# Patient Record
Sex: Male | Born: 1980 | Race: White | Hispanic: No | Marital: Married | Smoking: Never smoker
Health system: Southern US, Community
[De-identification: ages and names within clinical notes are randomized; demographics above are authoritative.]

## PROBLEM LIST (undated history)

## (undated) DIAGNOSIS — Z83719 Family history of colon polyps, unspecified: Secondary | ICD-10-CM

## (undated) DIAGNOSIS — D369 Benign neoplasm, unspecified site: Secondary | ICD-10-CM

## (undated) DIAGNOSIS — R05 Cough: Secondary | ICD-10-CM

## (undated) DIAGNOSIS — S43439A Superior glenoid labrum lesion of unspecified shoulder, initial encounter: Secondary | ICD-10-CM

## (undated) DIAGNOSIS — M1990 Unspecified osteoarthritis, unspecified site: Secondary | ICD-10-CM

## (undated) HISTORY — DX: Family history of colon polyps, unspecified: Z83.719

## (undated) HISTORY — DX: Benign neoplasm, unspecified site: D36.9

## (undated) HISTORY — PX: NO PAST SURGERIES: SHX2092

---

## 2013-02-07 ENCOUNTER — Other Ambulatory Visit: Payer: Self-pay | Admitting: Orthopedic Surgery

## 2013-02-07 DIAGNOSIS — M25511 Pain in right shoulder: Secondary | ICD-10-CM

## 2013-02-17 DIAGNOSIS — M1990 Unspecified osteoarthritis, unspecified site: Secondary | ICD-10-CM

## 2013-02-17 DIAGNOSIS — S43439A Superior glenoid labrum lesion of unspecified shoulder, initial encounter: Secondary | ICD-10-CM

## 2013-02-17 HISTORY — DX: Unspecified osteoarthritis, unspecified site: M19.90

## 2013-02-17 HISTORY — DX: Superior glenoid labrum lesion of unspecified shoulder, initial encounter: S43.439A

## 2013-02-21 ENCOUNTER — Ambulatory Visit
Admission: RE | Admit: 2013-02-21 | Discharge: 2013-02-21 | Disposition: A | Discharge status: Home / Self Care | Payer: BC Managed Care – PPO | Source: Ambulatory Visit | Attending: Orthopedic Surgery | Admitting: Orthopedic Surgery

## 2013-02-21 DIAGNOSIS — M25511 Pain in right shoulder: Secondary | ICD-10-CM

## 2013-02-21 MED ORDER — IOHEXOL 180 MG/ML  SOLN
15.0000 mL | Freq: Once | INTRAMUSCULAR | Status: AC | PRN
  Administered 2013-02-21: 15 mL via INTRA_ARTICULAR

## 2013-03-12 ENCOUNTER — Encounter (HOSPITAL_BASED_OUTPATIENT_CLINIC_OR_DEPARTMENT_OTHER): Payer: Self-pay | Admitting: *Deleted

## 2013-03-12 DIAGNOSIS — R059 Cough, unspecified: Secondary | ICD-10-CM

## 2013-03-12 HISTORY — DX: Cough, unspecified: R05.9

## 2013-03-12 NOTE — Progress Notes (Signed)
03/12/13 1206  OBSTRUCTIVE SLEEP APNEA  Have you ever been diagnosed with sleep apnea through a sleep study? No  Do you snore loudly (loud enough to be heard through closed doors)?  1  Do you often feel tired, fatigued, or sleepy during the daytime? 1  Has anyone observed you stop breathing during your sleep? 1  Do you have, or are you being treated for high blood pressure? 0  BMI more than 35 kg/m2? 0  Age over 34 years old? 0  Gender: 1  Obstructive Sleep Apnea Score 4  Score 4 or greater  Results sent to PCP (Dr. Elias Else)

## 2013-03-13 ENCOUNTER — Other Ambulatory Visit: Payer: Self-pay | Admitting: Orthopedic Surgery

## 2013-03-19 NOTE — H&P (Signed)
Nathan Terrell is an 32 y.o. male.   Chief Complaint: c/o chronic and progressive right shoulder pain HPI: . 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 He is right hand dominant. He is a recreational weight lifter and very active husband and father.  He enjoys playing ice hockey. He has had no history of dislocation of the shoulder. In late May of 2014 he sustained a strain injury to his right shoulder when he was blocking in front of a net. Since that time he has had chronic pain in his right shoulder he perceives anteriorly aggravated by push ups or other weight training exercises. He has tried to go to the gym repeatedly and cannot lift more than a few moments without significant shoulder pain. He has difficulty reaching behind his back. He has some weakness with overhead activities.   Past Medical History  Diagnosis Date  . SLAP lesion, type II 02/2013    right  . 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 Cough 03/12/2013    Past Surgical History  Procedure Laterality Date  . No past surgeries      History reviewed. No pertinent family history. Social History:  reports that he has never smoked. He has never used smokeless tobacco. He reports that he drinks alcohol. He reports that he does not use illicit drugs.  Allergies: No Known Allergies  No prescriptions prior to admission    No results found for this or any previous visit (from the past 48 hour(s)).  No results found.   A comprehensive review of systems was negative.  Height 5\' 10"  (1.778 m), weight 81.647 kg (180 lb).  General appearance: alert Head: Normocephalic, without obvious abnormality Neck: supple, symmetrical, trachea midline Resp: clear to auscultation bilaterally Cardio: regular rate and rhythm GI: normal findings: bowel sounds normal Extremities: His shoulder ROM reveals combined elevation right shoulder 165, left shoulder 175, external  rotation right shoulder at 90 degrees abduction 90 degrees, left shoulder 95, internal rotation right shoulder T10, left shoulder T8. He has tightness with internal rotation behind his back and has a painful push off test on the right, negative on the left. Isolated muscle testing reveals minimal pain and full strength in internal/external rotation at neutral, scaption and forward flexion His O'Brien's test is painful. He has pain with cross torso adduction. He is nontender on direct palpation of the long head of the biceps. His Yergason's maneuver is painless.   Plain films of his shoulder demonstrate normal bony anatomy. He does not show signs of significant glenohumeral arthritis or AC degenerative change. He does have a type I acromion. There is no evidence of calcification.   MRI of his right shoulder.  Dang has advanced AC degenerative arthritis with unfavorable morphology of his acromion.  This is the reason for his night pain.  He also has a type II SLAP lesion with instability of the proximal origin of the biceps extending from 12 o'clock to 10 o'clock posteriorly.     Pulses: 2+ and symmetric Skin: normal Neurologic: Grossly normal    Assessment/Plan Impression:  Internal derangement of right shoulder.  Acromio-clavicular arthritis. Sub-a.c. joint impingement symptoms. Type II SLAP lesion.  Plan: To the OR for right SA with SAD/DCR and biceps tenodesis and possible RC repair.The procedure, risks,benefits and post-op course were discussed with the patient at length and they were in agreement with the plan.  DASNOIT,Nathan Terrell 03/19/2013, 4:48 PM  H&P documentation: 03/20/2013  -  History and Physical Reviewed  -Patient has been re-examined  -No change in the plan of care  Wyn Forster, MD

## 2013-03-20 ENCOUNTER — Ambulatory Visit (HOSPITAL_BASED_OUTPATIENT_CLINIC_OR_DEPARTMENT_OTHER)
Admission: RE | Admit: 2013-03-20 | Discharge: 2013-03-20 | Disposition: A | Discharge status: Home / Self Care | Payer: BC Managed Care – PPO | Source: Ambulatory Visit | Attending: Orthopedic Surgery | Admitting: Orthopedic Surgery

## 2013-03-20 ENCOUNTER — Encounter (HOSPITAL_BASED_OUTPATIENT_CLINIC_OR_DEPARTMENT_OTHER): Payer: BC Managed Care – PPO | Admitting: Anesthesiology

## 2013-03-20 ENCOUNTER — Ambulatory Visit (HOSPITAL_BASED_OUTPATIENT_CLINIC_OR_DEPARTMENT_OTHER): Payer: BC Managed Care – PPO | Admitting: Anesthesiology

## 2013-03-20 ENCOUNTER — Encounter (HOSPITAL_BASED_OUTPATIENT_CLINIC_OR_DEPARTMENT_OTHER): Payer: Self-pay | Admitting: *Deleted

## 2013-03-20 ENCOUNTER — Encounter (HOSPITAL_BASED_OUTPATIENT_CLINIC_OR_DEPARTMENT_OTHER)
Admission: RE | Disposition: A | Discharge status: Home / Self Care | Payer: Self-pay | Source: Ambulatory Visit | Attending: Orthopedic Surgery

## 2013-03-20 DIAGNOSIS — M19019 Primary osteoarthritis, unspecified shoulder: Secondary | ICD-10-CM | POA: Insufficient documentation

## 2013-03-20 DIAGNOSIS — M24119 Other articular cartilage disorders, unspecified shoulder: Secondary | ICD-10-CM | POA: Insufficient documentation

## 2013-03-20 DIAGNOSIS — M25819 Other specified joint disorders, unspecified shoulder: Secondary | ICD-10-CM | POA: Insufficient documentation

## 2013-03-20 DIAGNOSIS — M24419 Recurrent dislocation, unspecified shoulder: Secondary | ICD-10-CM | POA: Insufficient documentation

## 2013-03-20 DIAGNOSIS — Z5333 Arthroscopic surgical procedure converted to open procedure: Secondary | ICD-10-CM | POA: Insufficient documentation

## 2013-03-20 HISTORY — DX: Unspecified osteoarthritis, unspecified site: M19.90

## 2013-03-20 HISTORY — DX: Superior glenoid labrum lesion of unspecified shoulder, initial encounter: S43.439A

## 2013-03-20 HISTORY — PX: SHOULDER ARTHROSCOPY WITH SUBACROMIAL DECOMPRESSION, ROTATOR CUFF REPAIR AND BICEP TENDON REPAIR: SHX5687

## 2013-03-20 HISTORY — DX: Cough: R05

## 2013-03-20 LAB — POCT HEMOGLOBIN-HEMACUE: Hemoglobin: 16.3 g/dL (ref 13.0–17.0)

## 2013-03-20 SURGERY — SHOULDER ARTHROSCOPY WITH SUBACROMIAL DECOMPRESSION, ROTATOR CUFF REPAIR AND BICEP TENDON REPAIR
Anesthesia: General | Site: Shoulder | Laterality: Right

## 2013-03-20 MED ORDER — LIDOCAINE HCL (CARDIAC) 10 MG/ML IV SOLN
INTRAVENOUS | Status: DC | PRN
  Administered 2013-03-20: 100 mg via INTRAVENOUS

## 2013-03-20 MED ORDER — HYDROMORPHONE HCL 2 MG PO TABS: ORAL_TABLET | ORAL | Status: DC

## 2013-03-20 MED ORDER — MIDAZOLAM HCL 5 MG/5ML IJ SOLN
INTRAMUSCULAR | Status: DC | PRN
  Administered 2013-03-20: 2 mg via INTRAVENOUS

## 2013-03-20 MED ORDER — BUPIVACAINE-EPINEPHRINE PF 0.5-1:200000 % IJ SOLN
INTRAMUSCULAR | Status: DC | PRN
  Administered 2013-03-20: 25 mL

## 2013-03-20 MED ORDER — LACTATED RINGERS IV SOLN
INTRAVENOUS | Status: DC
  Administered 2013-03-20: 14:00:00 via INTRAVENOUS
  Administered 2013-03-20: 20 mL/h via INTRAVENOUS

## 2013-03-20 MED ORDER — DEXAMETHASONE SODIUM PHOSPHATE 4 MG/ML IJ SOLN
INTRAMUSCULAR | Status: DC | PRN
  Administered 2013-03-20: 10 mg via INTRAVENOUS

## 2013-03-20 MED ORDER — HYDROMORPHONE HCL PF 1 MG/ML IJ SOLN: 0.2500 mg | INTRAMUSCULAR | Status: DC | PRN

## 2013-03-20 MED ORDER — HYDROMORPHONE HCL 2 MG PO TABS: 2.0000 mg | ORAL_TABLET | ORAL | Status: DC | PRN

## 2013-03-20 MED ORDER — PROMETHAZINE HCL 25 MG/ML IJ SOLN
6.2500 mg | INTRAMUSCULAR | Status: DC | PRN
  Administered 2013-03-20: 6.25 mg via INTRAVENOUS

## 2013-03-20 MED ORDER — SUCCINYLCHOLINE CHLORIDE 20 MG/ML IJ SOLN
INTRAMUSCULAR | Status: DC | PRN
  Administered 2013-03-20: 100 mg via INTRAVENOUS

## 2013-03-20 MED ORDER — MIDAZOLAM HCL 2 MG/ML PO SYRP: 12.0000 mg | ORAL_SOLUTION | Freq: Once | ORAL | Status: DC | PRN

## 2013-03-20 MED ORDER — CEFAZOLIN SODIUM-DEXTROSE 2-3 GM-% IV SOLR
2.0000 g | Freq: Once | INTRAVENOUS | Status: AC
  Administered 2013-03-20: 2 g via INTRAVENOUS

## 2013-03-20 MED ORDER — FENTANYL CITRATE 0.05 MG/ML IJ SOLN
INTRAMUSCULAR | Status: AC
  Filled 2013-03-20: qty 2

## 2013-03-20 MED ORDER — MIDAZOLAM HCL 2 MG/2ML IJ SOLN
INTRAMUSCULAR | Status: AC
  Filled 2013-03-20: qty 2

## 2013-03-20 MED ORDER — CHLORHEXIDINE GLUCONATE 4 % EX LIQD: 60.0000 mL | Freq: Once | CUTANEOUS | Status: DC

## 2013-03-20 MED ORDER — MIDAZOLAM HCL 2 MG/2ML IJ SOLN
1.0000 mg | INTRAMUSCULAR | Status: DC | PRN
  Administered 2013-03-20: 2 mg via INTRAVENOUS

## 2013-03-20 MED ORDER — PROMETHAZINE HCL 25 MG/ML IJ SOLN
INTRAMUSCULAR | Status: AC
  Filled 2013-03-20: qty 1

## 2013-03-20 MED ORDER — SODIUM CHLORIDE 0.9 % IR SOLN
Status: DC | PRN
  Administered 2013-03-20: 8000 mL

## 2013-03-20 MED ORDER — DEXAMETHASONE SODIUM PHOSPHATE 10 MG/ML IJ SOLN
INTRAMUSCULAR | Status: DC | PRN
  Administered 2013-03-20: 4 mg

## 2013-03-20 MED ORDER — FENTANYL CITRATE 0.05 MG/ML IJ SOLN
INTRAMUSCULAR | Status: DC | PRN
  Administered 2013-03-20 (×4): 25 ug via INTRAVENOUS

## 2013-03-20 MED ORDER — ONDANSETRON HCL 4 MG/2ML IJ SOLN
INTRAMUSCULAR | Status: DC | PRN
  Administered 2013-03-20: 4 mg via INTRAVENOUS

## 2013-03-20 MED ORDER — MIDAZOLAM HCL 2 MG/2ML IJ SOLN: 1.0000 mg | INTRAMUSCULAR | Status: DC | PRN

## 2013-03-20 MED ORDER — CEPHALEXIN 500 MG PO CAPS: 500.0000 mg | ORAL_CAPSULE | Freq: Three times a day (TID) | ORAL | Status: DC

## 2013-03-20 MED ORDER — OXYCODONE HCL 5 MG/5ML PO SOLN: 5.0000 mg | Freq: Once | ORAL | Status: DC | PRN

## 2013-03-20 MED ORDER — OXYCODONE HCL 5 MG PO TABS: 5.0000 mg | ORAL_TABLET | Freq: Once | ORAL | Status: DC | PRN

## 2013-03-20 MED ORDER — FENTANYL CITRATE 0.05 MG/ML IJ SOLN: 50.0000 ug | INTRAMUSCULAR | Status: DC | PRN

## 2013-03-20 MED ORDER — PROPOFOL 10 MG/ML IV BOLUS
INTRAVENOUS | Status: DC | PRN
  Administered 2013-03-20: 200 mg via INTRAVENOUS

## 2013-03-20 MED ORDER — FENTANYL CITRATE 0.05 MG/ML IJ SOLN
50.0000 ug | Freq: Once | INTRAMUSCULAR | Status: AC
  Administered 2013-03-20: 100 ug via INTRAVENOUS

## 2013-03-20 SURGICAL SUPPLY — 81 items
BANDAGE ADHESIVE 1X3 (GAUZE/BANDAGES/DRESSINGS) IMPLANT
BLADE AVERAGE 25X9 (BLADE) IMPLANT
BLADE CUTTER MENIS 5.5 (BLADE) ×2 IMPLANT
BLADE SURG 15 STRL LF DISP TIS (BLADE) ×2 IMPLANT
BLADE SURG 15 STRL SS (BLADE) ×2
BLADE SURG ROTATE 9660 (MISCELLANEOUS) ×2 IMPLANT
BUR EGG 3PK/BX (BURR) IMPLANT
BUR OVAL 6.0 (BURR) ×2 IMPLANT
CANISTER SUCT 3000ML (MISCELLANEOUS) IMPLANT
CANISTER SUCT LVC 12 LTR MEDI- (MISCELLANEOUS) IMPLANT
CANNULA SHOULDER 7CM (CANNULA) IMPLANT
CANNULA TWIST IN 8.25X7CM (CANNULA) ×2 IMPLANT
CLEANER CAUTERY TIP 5X5 PAD (MISCELLANEOUS) IMPLANT
CUTTER MENISCUS  4.2MM (BLADE) ×1
CUTTER MENISCUS 4.2MM (BLADE) ×1 IMPLANT
DECANTER SPIKE VIAL GLASS SM (MISCELLANEOUS) IMPLANT
DRAPE INCISE IOBAN 66X45 STRL (DRAPES) ×2 IMPLANT
DRAPE STERI 35X30 U-POUCH (DRAPES) ×2 IMPLANT
DRAPE SURG 17X23 STRL (DRAPES) ×2 IMPLANT
DRAPE U-SHAPE 47X51 STRL (DRAPES) ×2 IMPLANT
DRAPE U-SHAPE 76X120 STRL (DRAPES) ×4 IMPLANT
DRSG PAD ABDOMINAL 8X10 ST (GAUZE/BANDAGES/DRESSINGS) ×2 IMPLANT
DURAPREP 26ML APPLICATOR (WOUND CARE) ×2 IMPLANT
ELECT REM PT RETURN 9FT ADLT (ELECTROSURGICAL) ×2
ELECTRODE REM PT RTRN 9FT ADLT (ELECTROSURGICAL) ×1 IMPLANT
GLOVE BIOGEL M STRL SZ7.5 (GLOVE) ×2 IMPLANT
GLOVE BIOGEL PI IND STRL 7.0 (GLOVE) ×2 IMPLANT
GLOVE BIOGEL PI IND STRL 8 (GLOVE) ×3 IMPLANT
GLOVE BIOGEL PI INDICATOR 7.0 (GLOVE) ×2
GLOVE BIOGEL PI INDICATOR 8 (GLOVE) ×3
GLOVE ECLIPSE 6.5 STRL STRAW (GLOVE) ×4 IMPLANT
GLOVE ORTHO TXT STRL SZ7.5 (GLOVE) ×2 IMPLANT
GOWN BRE IMP PREV XXLGXLNG (GOWN DISPOSABLE) ×2 IMPLANT
GOWN PREVENTION PLUS XLARGE (GOWN DISPOSABLE) ×2 IMPLANT
KIT BIO-TENODESIS 3X8 DISP (MISCELLANEOUS) ×1
KIT INSRT BABSR STRL DISP BTN (MISCELLANEOUS) ×1 IMPLANT
NDL SUT 6 .5 CRC .975X.05 MAYO (NEEDLE) ×1 IMPLANT
NEEDLE MAYO TAPER (NEEDLE) ×1
NEEDLE MINI RC 24MM (NEEDLE) IMPLANT
NEEDLE SCORPION (NEEDLE) ×2 IMPLANT
PACK ARTHROSCOPY DSU (CUSTOM PROCEDURE TRAY) ×2 IMPLANT
PACK BASIN DAY SURGERY FS (CUSTOM PROCEDURE TRAY) ×2 IMPLANT
PAD CLEANER CAUTERY TIP 5X5 (MISCELLANEOUS)
PASSER SUT SWANSON 36MM LOOP (INSTRUMENTS) IMPLANT
PENCIL BUTTON HOLSTER BLD 10FT (ELECTRODE) ×2 IMPLANT
SCREW PEEK TENODESIS 7X23M (Screw) ×2 IMPLANT
SLEEVE SCD COMPRESS KNEE MED (MISCELLANEOUS) ×2 IMPLANT
SLING ARM FOAM STRAP LRG (SOFTGOODS) IMPLANT
SLING ARM FOAM STRAP MED (SOFTGOODS) IMPLANT
SLING ARM FOAM STRAP XLG (SOFTGOODS) ×2 IMPLANT
SPONGE GAUZE 4X4 12PLY (GAUZE/BANDAGES/DRESSINGS) ×2 IMPLANT
SPONGE LAP 4X18 X RAY DECT (DISPOSABLE) ×2 IMPLANT
STRIP CLOSURE SKIN 1/2X4 (GAUZE/BANDAGES/DRESSINGS) ×2 IMPLANT
SUCTION FRAZIER TIP 10 FR DISP (SUCTIONS) IMPLANT
SUT ETHIBOND 2 OS 4 DA (SUTURE) IMPLANT
SUT ETHILON 4 0 PS 2 18 (SUTURE) IMPLANT
SUT FIBERWIRE #2 38 T-5 BLUE (SUTURE) ×2
SUT FIBERWIRE 3-0 18 TAPR NDL (SUTURE)
SUT PROLENE 1 CT (SUTURE) IMPLANT
SUT PROLENE 3 0 PS 2 (SUTURE) ×2 IMPLANT
SUT TIGER TAPE 7 IN WHITE (SUTURE) IMPLANT
SUT VIC AB 0 CT1 27 (SUTURE)
SUT VIC AB 0 CT1 27XBRD ANBCTR (SUTURE) IMPLANT
SUT VIC AB 0 SH 27 (SUTURE) ×2 IMPLANT
SUT VIC AB 2-0 SH 27 (SUTURE) ×1
SUT VIC AB 2-0 SH 27XBRD (SUTURE) ×1 IMPLANT
SUT VIC AB 3-0 SH 27 (SUTURE)
SUT VIC AB 3-0 SH 27X BRD (SUTURE) IMPLANT
SUT VIC AB 3-0 X1 27 (SUTURE) IMPLANT
SUTURE FIBERWR #2 38 T-5 BLUE (SUTURE) ×1 IMPLANT
SUTURE FIBERWR 3-0 18 TAPR NDL (SUTURE) IMPLANT
SYR 3ML 23GX1 SAFETY (SYRINGE) IMPLANT
SYR BULB 3OZ (MISCELLANEOUS) IMPLANT
TAPE FIBER 2MM 7IN #2 BLUE (SUTURE) ×2 IMPLANT
TAPE PAPER 3X10 WHT MICROPORE (GAUZE/BANDAGES/DRESSINGS) ×2 IMPLANT
TOWEL OR 17X24 6PK STRL BLUE (TOWEL DISPOSABLE) ×2 IMPLANT
TUBE CONNECTING 20X1/4 (TUBING) ×4 IMPLANT
TUBING ARTHROSCOPY IRRIG 16FT (MISCELLANEOUS) ×2 IMPLANT
WAND STAR VAC 90 (SURGICAL WAND) ×2 IMPLANT
WATER STERILE IRR 1000ML POUR (IV SOLUTION) ×2 IMPLANT
YANKAUER SUCT BULB TIP NO VENT (SUCTIONS) IMPLANT

## 2013-03-20 NOTE — Progress Notes (Signed)
Assisted Dr. Kasik with right, ultrasound guided, interscalene  block. Side rails up, monitors on throughout procedure. See vital signs in flow sheet. Tolerated Procedure well. 

## 2013-03-20 NOTE — Anesthesia Procedure Notes (Addendum)
Procedure Name: Intubation Date/Time: 03/20/2013 1:14 PM Performed by: Gar Gibbon Pre-anesthesia Checklist: Patient identified, Emergency Drugs available, Suction available and Patient being monitored Patient Re-evaluated:Patient Re-evaluated prior to inductionOxygen Delivery Method: Circle System Utilized Preoxygenation: Pre-oxygenation with 100% oxygen Intubation Type: IV induction Ventilation: Mask ventilation without difficulty Laryngoscope Size: Miller and 3 Grade View: Grade II Tube type: Oral Tube size: 8.0 mm Number of attempts: 1 Airway Equipment and Method: stylet and oral airway Placement Confirmation: ETT inserted through vocal cords under direct vision,  positive ETCO2 and breath sounds checked- equal and bilateral Secured at: 22 cm Tube secured with: Tape Dental Injury: Teeth and Oropharynx as per pre-operative assessment    Anesthesia Regional Block:  Interscalene brachial plexus block  Pre-Anesthetic Checklist: ,, timeout performed, Correct Patient, Correct Site, Correct Laterality, Correct Procedure, Correct Position, site marked, Risks and benefits discussed,  Surgical consent,  Pre-op evaluation,  At surgeon's request and post-op pain management  Laterality: Right  Prep: chloraprep       Needles:  Injection technique: Single-shot  Needle Type: Echogenic Stimulator Needle     Needle Length: 5cm 5 cm Needle Gauge: 22 and 22 G    Additional Needles:  Procedures: ultrasound guided (picture in chart) Interscalene brachial plexus block Narrative:  Start time: 03/20/2013 12:42 PM End time: 03/20/2013 12:55 PM Injection made incrementally with aspirations every 3 mL. Anesthesiologist: Dr Gypsy Balsam  Additional Notes: [id number] R ISB POP CHG prep, sterile tech #22 echo needle with [place] visualization-PIX in chart Marc .5% w/epi 1:200000 total 25cc+decadron 4mg  infiltrated Multiple neg asp No compl Dr Gypsy Balsam

## 2013-03-20 NOTE — Transfer of Care (Signed)
Immediate Anesthesia Transfer of Care Note  Patient: Remington Highbaugh  Procedure(s) Performed: Procedure(s): RIGHT SHOULDER ARTHROSCOPY WITH SUBACROMIAL DECOMPRESSION, DISTAL CLAVICLE RESECTION, OPEN BICEPS TENODESIS (Right)  Patient Location: PACU  Anesthesia Type:GA combined with regional for post-op pain  Level of Consciousness: awake, sedated and patient cooperative  Airway & Oxygen Therapy: Patient Spontanous Breathing and Patient connected to face mask oxygen  Post-op Assessment: Report given to PACU RN and Post -op Vital signs reviewed and stable  Post vital signs: Reviewed and stable  Complications: No apparent anesthesia complications

## 2013-03-20 NOTE — Anesthesia Postprocedure Evaluation (Signed)
  Anesthesia Post-op Note  Patient: Nathan Terrell  Procedure(s) Performed: Procedure(s): RIGHT SHOULDER ARTHROSCOPY WITH SUBACROMIAL DECOMPRESSION, DISTAL CLAVICLE RESECTION, OPEN BICEPS TENODESIS (Right)  Patient Location: PACU  Anesthesia Type:GA combined with regional for post-op pain  Level of Consciousness: awake  Airway and Oxygen Therapy: Patient Spontanous Breathing  Post-op Pain: none  Post-op Assessment: Post-op Vital signs reviewed, Patient's Cardiovascular Status Stable, Respiratory Function Stable, Patent Airway, No signs of Nausea or vomiting and Pain level controlled  Post-op Vital Signs: Reviewed and stable  Complications: No apparent anesthesia complications

## 2013-03-20 NOTE — Anesthesia Preprocedure Evaluation (Signed)
Anesthesia Evaluation  Patient identified by MRN, date of birth, ID band Patient awake    Reviewed: Allergy & Precautions, H&P , NPO status , Patient's Chart, lab work & pertinent test results  Airway Mallampati: I TM Distance: >3 FB Neck ROM: Full    Dental   Pulmonary  breath sounds clear to auscultation        Cardiovascular Rhythm:Regular Rate:Normal     Neuro/Psych    GI/Hepatic   Endo/Other    Renal/GU      Musculoskeletal   Abdominal   Peds  Hematology   Anesthesia Other Findings   Reproductive/Obstetrics                           Anesthesia Physical Anesthesia Plan  ASA: I  Anesthesia Plan: General   Post-op Pain Management:    Induction: Intravenous  Airway Management Planned: Oral ETT  Additional Equipment:   Intra-op Plan:   Post-operative Plan: Extubation in OR  Informed Consent: I have reviewed the patients History and Physical, chart, labs and discussed the procedure including the risks, benefits and alternatives for the proposed anesthesia with the patient or authorized representative who has indicated his/her understanding and acceptance.     Plan Discussed with: CRNA and Surgeon  Anesthesia Plan Comments:         Anesthesia Quick Evaluation  

## 2013-03-20 NOTE — Brief Op Note (Signed)
03/20/2013  2:55 PM  PATIENT:  Nathan Terrell  32 y.o. male  PRE-OPERATIVE DIAGNOSIS:  RIGHT SLAP II, ACROMIOCLAVICULAR DEGENERATIVE JOINT DISEASE  POST-OPERATIVE DIAGNOSIS:  RIGHT SLAP II, ACROMIOCLAVICULAR DEGENERATIVE JOINT DISEASE  PROCEDURE:  Procedure(s): RIGHT SHOULDER ARTHROSCOPY WITH SUBACROMIAL DECOMPRESSION, DISTAL CLAVICLE RESECTION, OPEN BICEPS TENODESIS (Right)  SURGEON:  Surgeon(s) and Role:    * Wyn Forster., MD - Primary  PHYSICIAN ASSISTANT:   ASSISTANTS: Mallory Shirk.A-C    ANESTHESIA:   general  EBL:  Total I/O In: 1000 [I.V.:1000] Out: -   BLOOD ADMINISTERED:none  DRAINS: none   LOCAL MEDICATIONS USED: Interscalene block  SPECIMEN:  No Specimen  DISPOSITION OF SPECIMEN:  N/A  COUNTS:  YES  TOURNIQUET:  * No tourniquets in log *  DICTATION: .Other Dictation: Dictation Number [id number]  PLAN OF CARE: Discharge to home after PACU  PATIENT DISPOSITION:  PACU - hemodynamically stable.   Delay start of Pharmacological VTE agent (>24hrs) due to surgical blood loss or risk of bleeding: not applicable

## 2013-03-21 NOTE — Op Note (Signed)
NAME:  SHAHID, FLORI NO.:  [id number]  MEDICAL RECORD NO.:  [id number]  LOCATION:                                 FACILITY:  PHYSICIAN:  Katy Fitch. Renee Erb, M.D.      DATE OF BIRTH:  DATE OF PROCEDURE:  03/20/2013 DATE OF DISCHARGE:                              OPERATIVE REPORT   PREOPERATIVE DIAGNOSES:  MRI-documented type 2 superior labrum anterior and posterior lesion with unstable biceps origin, and unfavorable acromioclavicular anatomy, and type 3 acromion with impingement of anterior supraspinatus rotator cuff and rotator interval.  POSTOPERATIVE DIAGNOSES:  Confirmation of an unstable type 2 superior labrum anterior and posterior lesion with gross degeneration of anterior superior and posterior superior labrum and type 3 acromion, and prominent distal clavicle due to [facility] degenerative arthritis.  OPERATION: 1. Diagnostic arthroscopy, right glenohumeral joint followed by     arthroscopic biceps tenotomy and debridement of unstable labrum. 2. Arthroscopic subacromial decompression with bursectomy     coracoacromial ligament release, and acromioplasty. 3. Arthroscopic distal clavicle resection. 4. Open supra-pectoral biceps tenodesis utilizing an Arthrex 7-mm     tenodesis screw.  OPERATING SURGEON:  Katy Fitch. Edie Vallandingham, M.D.  ASSISTANT:  Marveen Reeks Dasnoit, PA-C  ANESTHESIA:  General endotracheal supplemented by an interscalene block.  SUPERVISING ANESTHESIOLOGIST:  Bedelia Person, M.D.  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  He enjoys playing recreational hockey and works out regularly in [employer]  After a blocking injury in front of a hockey net last year, he has had chronic right shoulder pain.  He has tried activity modification, anti- inflammatory medications, and rest; all without relief.  He was referred by his primary care physician, Dr. Elias Else for evaluation of this predicament.  Clinical  examination revealed signs of AC degenerative arthritis with direct tenderness at the [facility] joint and a clinical examination suggesting internal derangement of the shoulder, likely a SLAP lesion or biceps tendon injury. He was referred for an MR arthrogram of his shoulder, which documented an unstable type 2 SLAP lesion, and some labral irregularities anteriorly. He also was noted to have marked arthritis at the distal clavicle with edema in the distal clavicle and a prominent inferior distal clavicle and anterior acromion.  There was some signal changes in the supraspinatus due to chronic abrasion beneath the coracoacromial ligament and anterior acromion.  We advised Mr. Placencia as he had symptoms for more than 1 year to proceed with diagnostic arthroscopy anticipating decompression of his shoulder, biceps tenodesis, and labral debridement.  We did discuss labral repair, however, given his desire to continue weight training and playing hockey, in my judgment, a labral repair was not a good idea.  After detailed informed consent, he is brought to the operating room at this time.  Preoperatively, he was interviewed by Dr. Gypsy Balsam of Anesthesia.  General anesthesia by endotracheal technique was recommended and accepted.  Dr. Gypsy Balsam placed an interscalene block in the holding area under ultrasound guidance without complication leading to excellent anesthesia of the right arm and forequarter.  PROCEDURE:  Rocky Gladden was taken to room 2 of the [facility]  and placed in a supine position upon the operating table.  His right arm and shoulder had been marked per protocol with a marking pen as a proper surgical site.  In room 2 under Dr. Burnett Corrente direct supervision, general endotracheal anesthesia was induced followed by positioning in the beach-chair position with aid of a torso and head holder designed for shoulder arthroscopy.  Prior to the induction of anesthesia, passive  compression devices had been applied to his calves.  Taking care to assure that all bony prominences were protected and his knees relaxed with a pillow behind the knees, we subsequently prepped the right arm and shoulder with DuraPrep and placed impervious arthroscopy drapes.  The procedure commenced with a routine surgical time-out.  2 g of Ancef had been administered as an IV prophylactic antibiotic.  The shoulder was instrumented from the anterior aspect with a switching stick followed by placing the scope through a standard posterior viewing portal with blunt technique.  Diagnostic arthroscopy confirmed labral degenerative changes, which were documented with digital camera.  An anterior portal was created under direct vision with a spinal needle followed by a clear portal followed by use of a nerve hook to palpate the labrum and biceps origin.  There was a positive peel-back test and complete degeneration of the labrum from 2 o'clock anteriorly to approximately 10 o'clock posteriorly.  In my judgment, a SLAP repair was not indicated.  We then used a 5.5 mm suction shaver to debride the labrum, the biceps down to a 2 mm bridge and debris from the joint including synovitis.  Hemostasis was obtained with bipolar cautery.  After photographic documentation of the condition of the glenoid, humeral head, and remaining rotator cuff, we removed the arthroscope from the glenohumeral joint and placed in the subacromial space.  Florid bursitis was encountered, which was cleared with a 5.5 mm suction shaver.  The coracoacromial ligament was identified and released with cutting cautery followed by leveling the acromion to a type 1 morphology with the suction bur.  After further bursectomy, we inspected the cuff and found the bursal surface of the cuff intact.  The distal clavicle was indeed prominent, therefore an anterior portal was created and a suction bur was used to remove the distal  centimeter of clavicle.  Hemostasis was achieved with bipolar cautery, followed by photographic documentation of the digital camera.  We then marked the biceps with a spinal needle and proceeded to perform a 2-cm muscle-splitting incision to the anterior middle third of the deltoid.  The biceps was identified by palpation.  A whip stitch was placed in the biceps with 6 Krackow passes followed by release of the proximal biceps by traction.  We then completed a supra-pectoral biceps tenodesis with a 7 mm Arthrex tenodesis screw.  Excellent purchase was achieved with the cortical bone.  The redundant tendon had been debrided.  The tendon and bursal split was repaired with a running suture of 2-0 FiberWire with knots buried.  We then placed the scope back in the glenohumeral joint and performed a final debridement and irrigation of the joint.  Documentation of the biceps tenodesis was accomplished.  Further hemostasis achieved followed by removal of the arthroscopic equipment, repair of the portals with intradermal 3-0 Prolene.  The deltoid split was repaired with subcutaneous 2-0 Vicryl and intradermal 3-0 Prolene with Steri-Strips.  For aftercare, Mr. Bulman is discharged to the care of his family.  We will see him back in the office for followup evaluation in 24  hours to change his dressing to Tegaderm and begin immediate range of motion exercises.  Preoperatively, he was reminded that the biggest risk for this procedure would be the development of some shoulder stiffness if he does not participate in a postoperative exercise program.  We will reinforce these conditions in the morning.  For aftercare, he is discharged with prescription for Dilaudid 2 mg 1 or 2 tablets p.o. q.4-6 hours p.r.n. pain, 30 tablets without refill, also Keflex 500 mg 1 p.o. q.8 hours x4 days as prophylactic antibiotic.     Katy Fitch Dorcas Melito, M.D.     RVS/MEDQ  D:  03/20/2013  T:  03/21/2013  Job:   [id number]  cc:   Fulton Mole, MD

## 2013-03-22 ENCOUNTER — Encounter (HOSPITAL_BASED_OUTPATIENT_CLINIC_OR_DEPARTMENT_OTHER): Payer: Self-pay | Admitting: Orthopedic Surgery

## 2015-09-01 DIAGNOSIS — K219 Gastro-esophageal reflux disease without esophagitis: Secondary | ICD-10-CM | POA: Diagnosis not present

## 2015-09-01 DIAGNOSIS — M25532 Pain in left wrist: Secondary | ICD-10-CM | POA: Diagnosis not present

## 2015-10-02 IMAGING — RF DG FLUORO GUIDE NDL PLC/BX
2 series · 2 of 2 positions shown · non-contrast
Comparison: none

CLINICAL DATA: Right shoulder pain. Labral tear. Leydi Laura injury.

[Series 1: (hospital) · 1 of 1 slices shown (1 of 2)]
[im 1/1]
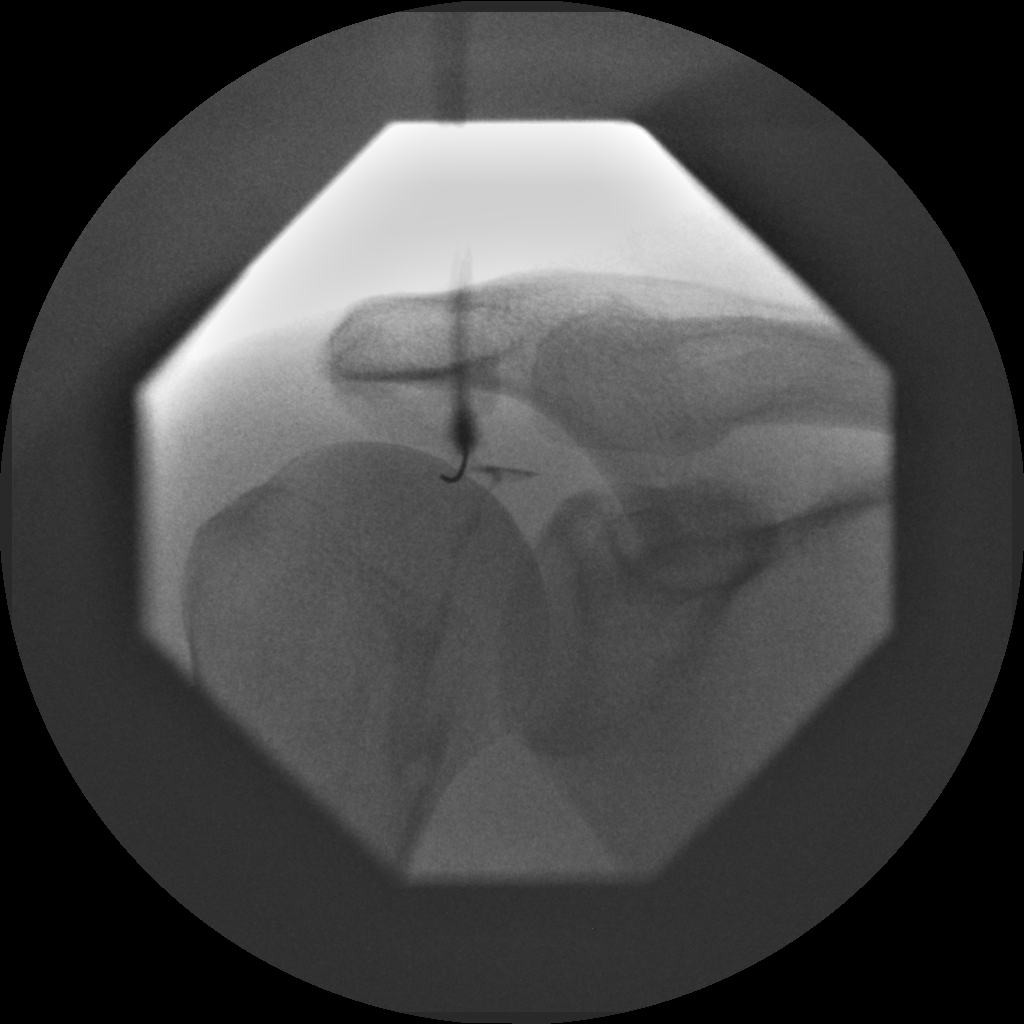

[Series 2: (hospital) · 1 of 1 slices shown (2 of 2)]
[im 1/1]
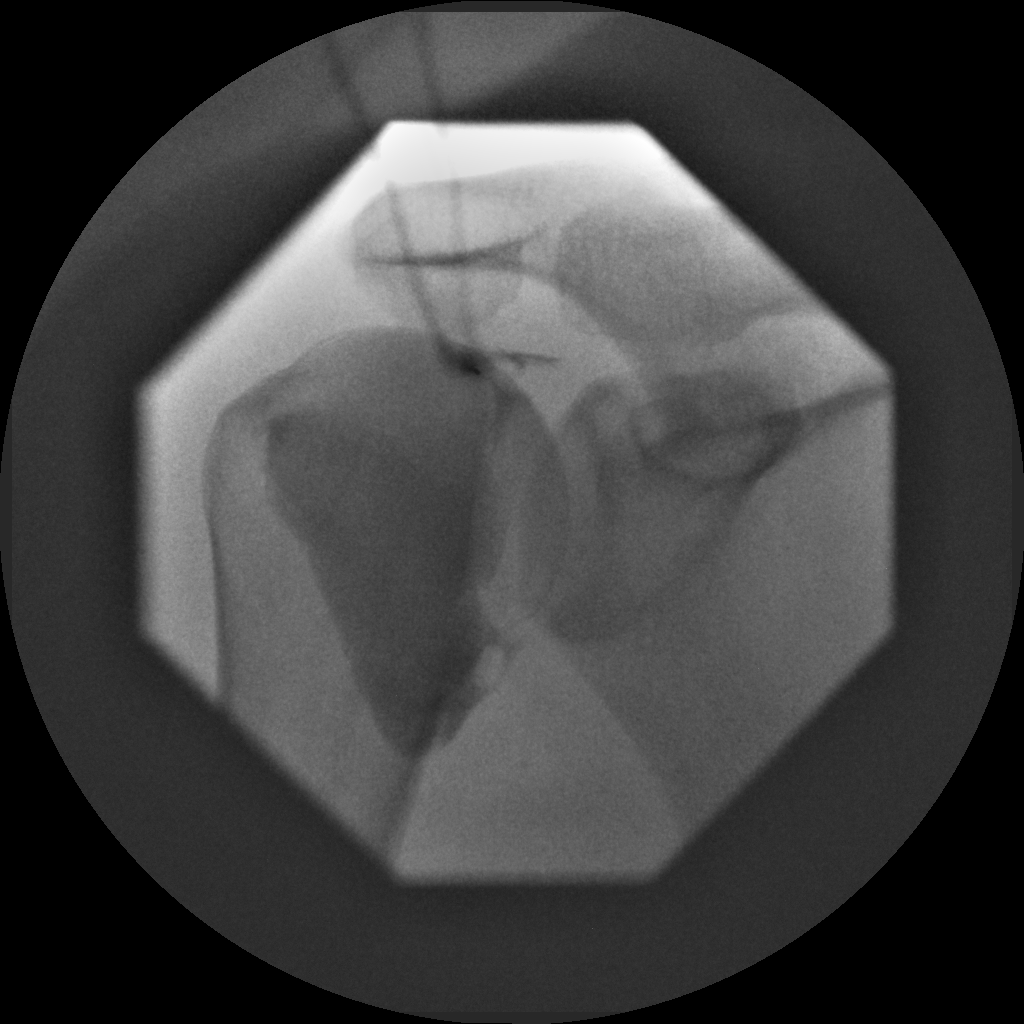

[2 of 2 positions shown; findings below may reference images not displayed]

EXAM:
SHOULDER INJECTION FOR MRI

FLUOROSCOPY TIME:  0 min 15 seconds

PROCEDURE:
After a thorough discussion of risks and benefits of the procedure,
written and oral informed consent was obtained. The consent
discussion included the risk of bleeding, infection and injury to
nerves and adjacent blood vessels. Extra-articular injection was
also a possible risk discussed. Verbal consent was obtained by Dr.
Mhlengi.

Preliminary localization was performed over the right shoulder. The
area was marked over superior medial anterior humeral head.

After prep and drape in the usual sterile fashion, the skin and
deeper subcutaneous tissues were anesthetized with 1% Lidocaine
without Epinephrine. Under fluoroscopic guidance, a 22 gauge
inch spinal needle was advanced into the joint over the superior
medial anterior humeral head using an anterior approach.
Intra-articular injection of Lidocaine was performed which flowed
freely and subsequently the joint was distended with 13 ml of a
[DATE] dilution of Multihance contrast. The MR arthrogram solution
was as follows: 15 ml of omnipaque 180 contrast agent, 0.1 mL
Multihance, 5 ml of 1% Lidocaine. An end point was felt as well as
the patient experiencing pressure and the injection was
discontinued, the needle removed, and a sterile dressing applied.
The patient was taken to MRI for subsequent imaging.

The patient tolerated the procedure well and there were no
complications.
IMPRESSION: Successful right shoulder fluoroscopically guided injection.

## 2016-04-26 DIAGNOSIS — Z13220 Encounter for screening for lipoid disorders: Secondary | ICD-10-CM | POA: Diagnosis not present

## 2016-04-26 DIAGNOSIS — Z23 Encounter for immunization: Secondary | ICD-10-CM | POA: Diagnosis not present

## 2016-04-26 DIAGNOSIS — Z0000 Encounter for general adult medical examination without abnormal findings: Secondary | ICD-10-CM | POA: Diagnosis not present

## 2016-05-24 DIAGNOSIS — R945 Abnormal results of liver function studies: Secondary | ICD-10-CM | POA: Diagnosis not present

## 2017-01-25 DIAGNOSIS — L720 Epidermal cyst: Secondary | ICD-10-CM | POA: Diagnosis not present

## 2017-04-27 DIAGNOSIS — M25532 Pain in left wrist: Secondary | ICD-10-CM | POA: Diagnosis not present

## 2017-04-27 DIAGNOSIS — K219 Gastro-esophageal reflux disease without esophagitis: Secondary | ICD-10-CM | POA: Diagnosis not present

## 2017-04-27 DIAGNOSIS — R140 Abdominal distension (gaseous): Secondary | ICD-10-CM | POA: Diagnosis not present

## 2017-04-27 DIAGNOSIS — Z0000 Encounter for general adult medical examination without abnormal findings: Secondary | ICD-10-CM | POA: Diagnosis not present

## 2017-04-27 DIAGNOSIS — Z23 Encounter for immunization: Secondary | ICD-10-CM | POA: Diagnosis not present

## 2017-04-29 DIAGNOSIS — Z0000 Encounter for general adult medical examination without abnormal findings: Secondary | ICD-10-CM | POA: Diagnosis not present

## 2017-04-29 DIAGNOSIS — Z13220 Encounter for screening for lipoid disorders: Secondary | ICD-10-CM | POA: Diagnosis not present

## 2018-05-10 DIAGNOSIS — Z23 Encounter for immunization: Secondary | ICD-10-CM | POA: Diagnosis not present

## 2018-05-10 DIAGNOSIS — Z0000 Encounter for general adult medical examination without abnormal findings: Secondary | ICD-10-CM | POA: Diagnosis not present

## 2018-05-10 DIAGNOSIS — Z13220 Encounter for screening for lipoid disorders: Secondary | ICD-10-CM | POA: Diagnosis not present

## 2018-05-10 DIAGNOSIS — K219 Gastro-esophageal reflux disease without esophagitis: Secondary | ICD-10-CM | POA: Diagnosis not present

## 2018-05-17 DIAGNOSIS — S61203A Unspecified open wound of left middle finger without damage to nail, initial encounter: Secondary | ICD-10-CM | POA: Diagnosis not present

## 2019-05-17 DIAGNOSIS — Z0000 Encounter for general adult medical examination without abnormal findings: Secondary | ICD-10-CM | POA: Diagnosis not present

## 2019-05-17 DIAGNOSIS — Z125 Encounter for screening for malignant neoplasm of prostate: Secondary | ICD-10-CM | POA: Diagnosis not present

## 2019-05-17 DIAGNOSIS — Z23 Encounter for immunization: Secondary | ICD-10-CM | POA: Diagnosis not present

## 2019-05-17 DIAGNOSIS — Z13220 Encounter for screening for lipoid disorders: Secondary | ICD-10-CM | POA: Diagnosis not present

## 2019-07-19 ENCOUNTER — Ambulatory Visit: Payer: Self-pay | Attending: Internal Medicine

## 2019-07-20 ENCOUNTER — Ambulatory Visit: Payer: Self-pay | Attending: Internal Medicine

## 2019-07-20 DIAGNOSIS — Z23 Encounter for immunization: Secondary | ICD-10-CM

## 2019-07-20 NOTE — Progress Notes (Signed)
   Covid-19 Vaccination Clinic  Name:  HUSAIN COSTABILE    MRN: [id number] DOB: 1980/07/27  07/20/2019  Mr. Fason was observed post Covid-19 immunization for 15 minutes without incident. He was provided with Vaccine Information Sheet and instruction to access the V-Safe system.   Mr. Diebold was instructed to call 911 with any severe reactions post vaccine: Marland Kitchen Difficulty breathing  . Swelling of face and throat  . A fast heartbeat  . A bad rash all over body  . Dizziness and weakness   Immunizations Administered    Name Date Dose VIS Date Route   Pfizer COVID-19 Vaccine 07/20/2019  2:29 PM 0.3 mL 03/30/2019 Intramuscular   Manufacturer: [employer], [employer]   Lot: [id number]   NDC: [address]-1

## 2019-08-13 ENCOUNTER — Ambulatory Visit: Payer: Self-pay

## 2019-12-05 DIAGNOSIS — M9904 Segmental and somatic dysfunction of sacral region: Secondary | ICD-10-CM | POA: Diagnosis not present

## 2019-12-05 DIAGNOSIS — M9903 Segmental and somatic dysfunction of lumbar region: Secondary | ICD-10-CM | POA: Diagnosis not present

## 2019-12-05 DIAGNOSIS — M9905 Segmental and somatic dysfunction of pelvic region: Secondary | ICD-10-CM | POA: Diagnosis not present

## 2019-12-05 DIAGNOSIS — M7918 Myalgia, other site: Secondary | ICD-10-CM | POA: Diagnosis not present

## 2019-12-18 DIAGNOSIS — M7918 Myalgia, other site: Secondary | ICD-10-CM | POA: Diagnosis not present

## 2019-12-18 DIAGNOSIS — M9905 Segmental and somatic dysfunction of pelvic region: Secondary | ICD-10-CM | POA: Diagnosis not present

## 2019-12-18 DIAGNOSIS — M9903 Segmental and somatic dysfunction of lumbar region: Secondary | ICD-10-CM | POA: Diagnosis not present

## 2019-12-18 DIAGNOSIS — M9904 Segmental and somatic dysfunction of sacral region: Secondary | ICD-10-CM | POA: Diagnosis not present

## 2019-12-19 DIAGNOSIS — M9904 Segmental and somatic dysfunction of sacral region: Secondary | ICD-10-CM | POA: Diagnosis not present

## 2019-12-19 DIAGNOSIS — M9903 Segmental and somatic dysfunction of lumbar region: Secondary | ICD-10-CM | POA: Diagnosis not present

## 2019-12-19 DIAGNOSIS — M7918 Myalgia, other site: Secondary | ICD-10-CM | POA: Diagnosis not present

## 2019-12-19 DIAGNOSIS — M9905 Segmental and somatic dysfunction of pelvic region: Secondary | ICD-10-CM | POA: Diagnosis not present

## 2019-12-25 DIAGNOSIS — M9903 Segmental and somatic dysfunction of lumbar region: Secondary | ICD-10-CM | POA: Diagnosis not present

## 2019-12-25 DIAGNOSIS — M5431 Sciatica, right side: Secondary | ICD-10-CM | POA: Diagnosis not present

## 2019-12-27 DIAGNOSIS — M9903 Segmental and somatic dysfunction of lumbar region: Secondary | ICD-10-CM | POA: Diagnosis not present

## 2019-12-27 DIAGNOSIS — M5431 Sciatica, right side: Secondary | ICD-10-CM | POA: Diagnosis not present

## 2019-12-31 DIAGNOSIS — M9903 Segmental and somatic dysfunction of lumbar region: Secondary | ICD-10-CM | POA: Diagnosis not present

## 2019-12-31 DIAGNOSIS — M5431 Sciatica, right side: Secondary | ICD-10-CM | POA: Diagnosis not present

## 2020-05-01 DIAGNOSIS — M9904 Segmental and somatic dysfunction of sacral region: Secondary | ICD-10-CM | POA: Diagnosis not present

## 2020-05-01 DIAGNOSIS — M9903 Segmental and somatic dysfunction of lumbar region: Secondary | ICD-10-CM | POA: Diagnosis not present

## 2020-05-01 DIAGNOSIS — M9905 Segmental and somatic dysfunction of pelvic region: Secondary | ICD-10-CM | POA: Diagnosis not present

## 2020-05-01 DIAGNOSIS — M7918 Myalgia, other site: Secondary | ICD-10-CM | POA: Diagnosis not present

## 2020-05-08 DIAGNOSIS — M9903 Segmental and somatic dysfunction of lumbar region: Secondary | ICD-10-CM | POA: Diagnosis not present

## 2020-05-08 DIAGNOSIS — M9904 Segmental and somatic dysfunction of sacral region: Secondary | ICD-10-CM | POA: Diagnosis not present

## 2020-05-08 DIAGNOSIS — M9905 Segmental and somatic dysfunction of pelvic region: Secondary | ICD-10-CM | POA: Diagnosis not present

## 2020-05-08 DIAGNOSIS — M7918 Myalgia, other site: Secondary | ICD-10-CM | POA: Diagnosis not present

## 2020-05-22 DIAGNOSIS — Z13220 Encounter for screening for lipoid disorders: Secondary | ICD-10-CM | POA: Diagnosis not present

## 2020-05-22 DIAGNOSIS — K219 Gastro-esophageal reflux disease without esophagitis: Secondary | ICD-10-CM | POA: Diagnosis not present

## 2020-05-22 DIAGNOSIS — Z3009 Encounter for other general counseling and advice on contraception: Secondary | ICD-10-CM | POA: Diagnosis not present

## 2020-05-22 DIAGNOSIS — Z125 Encounter for screening for malignant neoplasm of prostate: Secondary | ICD-10-CM | POA: Diagnosis not present

## 2020-05-22 DIAGNOSIS — R143 Flatulence: Secondary | ICD-10-CM | POA: Diagnosis not present

## 2020-05-22 DIAGNOSIS — Z0000 Encounter for general adult medical examination without abnormal findings: Secondary | ICD-10-CM | POA: Diagnosis not present

## 2020-05-22 DIAGNOSIS — Z23 Encounter for immunization: Secondary | ICD-10-CM | POA: Diagnosis not present

## 2020-07-21 DIAGNOSIS — E7800 Pure hypercholesterolemia, unspecified: Secondary | ICD-10-CM | POA: Diagnosis not present

## 2020-08-07 DIAGNOSIS — Z3009 Encounter for other general counseling and advice on contraception: Secondary | ICD-10-CM | POA: Diagnosis not present

## 2020-09-05 DIAGNOSIS — Z302 Encounter for sterilization: Secondary | ICD-10-CM | POA: Diagnosis not present

## 2020-09-19 DIAGNOSIS — M71122 Other infective bursitis, left elbow: Secondary | ICD-10-CM | POA: Diagnosis not present

## 2020-09-20 DIAGNOSIS — M71122 Other infective bursitis, left elbow: Secondary | ICD-10-CM | POA: Diagnosis not present

## 2021-03-23 DIAGNOSIS — M9905 Segmental and somatic dysfunction of pelvic region: Secondary | ICD-10-CM | POA: Diagnosis not present

## 2021-03-23 DIAGNOSIS — M9904 Segmental and somatic dysfunction of sacral region: Secondary | ICD-10-CM | POA: Diagnosis not present

## 2021-03-23 DIAGNOSIS — M5186 Other intervertebral disc disorders, lumbar region: Secondary | ICD-10-CM | POA: Diagnosis not present

## 2021-03-23 DIAGNOSIS — M9903 Segmental and somatic dysfunction of lumbar region: Secondary | ICD-10-CM | POA: Diagnosis not present

## 2021-03-30 DIAGNOSIS — M9904 Segmental and somatic dysfunction of sacral region: Secondary | ICD-10-CM | POA: Diagnosis not present

## 2021-03-30 DIAGNOSIS — M5186 Other intervertebral disc disorders, lumbar region: Secondary | ICD-10-CM | POA: Diagnosis not present

## 2021-03-30 DIAGNOSIS — M9905 Segmental and somatic dysfunction of pelvic region: Secondary | ICD-10-CM | POA: Diagnosis not present

## 2021-03-30 DIAGNOSIS — M9903 Segmental and somatic dysfunction of lumbar region: Secondary | ICD-10-CM | POA: Diagnosis not present

## 2021-04-21 DIAGNOSIS — M5186 Other intervertebral disc disorders, lumbar region: Secondary | ICD-10-CM | POA: Diagnosis not present

## 2021-04-21 DIAGNOSIS — M9905 Segmental and somatic dysfunction of pelvic region: Secondary | ICD-10-CM | POA: Diagnosis not present

## 2021-04-21 DIAGNOSIS — M9904 Segmental and somatic dysfunction of sacral region: Secondary | ICD-10-CM | POA: Diagnosis not present

## 2021-04-21 DIAGNOSIS — M9903 Segmental and somatic dysfunction of lumbar region: Secondary | ICD-10-CM | POA: Diagnosis not present

## 2021-05-25 DIAGNOSIS — M9904 Segmental and somatic dysfunction of sacral region: Secondary | ICD-10-CM | POA: Diagnosis not present

## 2021-05-25 DIAGNOSIS — M5186 Other intervertebral disc disorders, lumbar region: Secondary | ICD-10-CM | POA: Diagnosis not present

## 2021-05-25 DIAGNOSIS — M9905 Segmental and somatic dysfunction of pelvic region: Secondary | ICD-10-CM | POA: Diagnosis not present

## 2021-05-25 DIAGNOSIS — M9903 Segmental and somatic dysfunction of lumbar region: Secondary | ICD-10-CM | POA: Diagnosis not present

## 2021-06-19 DIAGNOSIS — M9903 Segmental and somatic dysfunction of lumbar region: Secondary | ICD-10-CM | POA: Diagnosis not present

## 2021-06-19 DIAGNOSIS — M5186 Other intervertebral disc disorders, lumbar region: Secondary | ICD-10-CM | POA: Diagnosis not present

## 2021-06-19 DIAGNOSIS — M9904 Segmental and somatic dysfunction of sacral region: Secondary | ICD-10-CM | POA: Diagnosis not present

## 2021-06-19 DIAGNOSIS — M9905 Segmental and somatic dysfunction of pelvic region: Secondary | ICD-10-CM | POA: Diagnosis not present

## 2022-02-10 DIAGNOSIS — Z23 Encounter for immunization: Secondary | ICD-10-CM | POA: Diagnosis not present

## 2022-02-10 DIAGNOSIS — Z0001 Encounter for general adult medical examination with abnormal findings: Secondary | ICD-10-CM | POA: Diagnosis not present

## 2022-02-10 DIAGNOSIS — Z1329 Encounter for screening for other suspected endocrine disorder: Secondary | ICD-10-CM | POA: Diagnosis not present

## 2022-02-10 DIAGNOSIS — Z13220 Encounter for screening for lipoid disorders: Secondary | ICD-10-CM | POA: Diagnosis not present

## 2022-02-10 DIAGNOSIS — Z1321 Encounter for screening for nutritional disorder: Secondary | ICD-10-CM | POA: Diagnosis not present

## 2022-02-10 DIAGNOSIS — Z79899 Other long term (current) drug therapy: Secondary | ICD-10-CM | POA: Diagnosis not present

## 2022-02-10 DIAGNOSIS — R351 Nocturia: Secondary | ICD-10-CM | POA: Diagnosis not present

## 2022-02-10 DIAGNOSIS — Z131 Encounter for screening for diabetes mellitus: Secondary | ICD-10-CM | POA: Diagnosis not present

## 2022-06-28 ENCOUNTER — Inpatient Hospital Stay (HOSPITAL_BASED_OUTPATIENT_CLINIC_OR_DEPARTMENT_OTHER)
Admission: EM | Admit: 2022-06-28 | Discharge: 2022-07-02 | DRG: 389 | Disposition: A | Discharge status: Home / Self Care | Payer: BC Managed Care – PPO | Attending: Internal Medicine | Admitting: Internal Medicine

## 2022-06-28 ENCOUNTER — Other Ambulatory Visit: Payer: Self-pay

## 2022-06-28 ENCOUNTER — Emergency Department (HOSPITAL_BASED_OUTPATIENT_CLINIC_OR_DEPARTMENT_OTHER): Payer: BC Managed Care – PPO

## 2022-06-28 ENCOUNTER — Encounter (HOSPITAL_BASED_OUTPATIENT_CLINIC_OR_DEPARTMENT_OTHER): Payer: Self-pay

## 2022-06-28 DIAGNOSIS — D120 Benign neoplasm of cecum: Secondary | ICD-10-CM | POA: Diagnosis present

## 2022-06-28 DIAGNOSIS — Z1152 Encounter for screening for COVID-19: Secondary | ICD-10-CM

## 2022-06-28 DIAGNOSIS — E785 Hyperlipidemia, unspecified: Secondary | ICD-10-CM | POA: Diagnosis present

## 2022-06-28 DIAGNOSIS — K56609 Unspecified intestinal obstruction, unspecified as to partial versus complete obstruction: Secondary | ICD-10-CM | POA: Diagnosis present

## 2022-06-28 DIAGNOSIS — D124 Benign neoplasm of descending colon: Secondary | ICD-10-CM | POA: Diagnosis present

## 2022-06-28 DIAGNOSIS — K561 Intussusception: Secondary | ICD-10-CM | POA: Diagnosis not present

## 2022-06-28 DIAGNOSIS — D122 Benign neoplasm of ascending colon: Secondary | ICD-10-CM | POA: Diagnosis present

## 2022-06-28 DIAGNOSIS — A0811 Acute gastroenteropathy due to Norwalk agent: Secondary | ICD-10-CM | POA: Diagnosis present

## 2022-06-28 DIAGNOSIS — D123 Benign neoplasm of transverse colon: Secondary | ICD-10-CM | POA: Diagnosis present

## 2022-06-28 DIAGNOSIS — K635 Polyp of colon: Secondary | ICD-10-CM | POA: Diagnosis present

## 2022-06-28 LAB — COMPREHENSIVE METABOLIC PANEL
ALT: 17 U/L (ref 0–44)
AST: 23 U/L (ref 15–41)
Albumin: 4.7 g/dL (ref 3.5–5.0)
Alkaline Phosphatase: 56 U/L (ref 38–126)
Anion gap: 11 (ref 5–15)
BUN: 21 mg/dL — ABNORMAL HIGH (ref 6–20)
CO2: 28 mmol/L (ref 22–32)
Calcium: 9.8 mg/dL (ref 8.9–10.3)
Chloride: 102 mmol/L (ref 98–111)
Creatinine, Ser: 1.34 mg/dL — ABNORMAL HIGH (ref 0.61–1.24)
GFR, Estimated: >60 mL/min (ref >60)
Glucose, Bld: 100 mg/dL — ABNORMAL HIGH (ref 70–99)
Potassium: 3.9 mmol/L (ref 3.5–5.1)
Sodium: 141 mmol/L (ref 135–145)
Total Bilirubin: 0.5 mg/dL (ref 0.3–1.2)
Total Protein: 7.3 g/dL (ref 6.5–8.1)

## 2022-06-28 LAB — CBC
HCT: 46.8 % (ref 39.0–52.0)
Hemoglobin: 15.8 g/dL (ref 13.0–17.0)
MCH: 29.7 pg (ref 26.0–34.0)
MCHC: 33.8 g/dL (ref 30.0–36.0)
MCV: 88 fL (ref 80.0–100.0)
Platelets: 231 10*3/uL (ref 150–400)
RBC: 5.32 MIL/uL (ref 4.22–5.81)
RDW: 13.2 % (ref 11.5–15.5)
WBC: 12 10*3/uL — ABNORMAL HIGH (ref 4.0–10.5)
nRBC: 0 % (ref 0.0–0.2)

## 2022-06-28 LAB — URINALYSIS, ROUTINE W REFLEX MICROSCOPIC
Bilirubin Urine: NEGATIVE
Glucose, UA: NEGATIVE mg/dL
Hgb urine dipstick: NEGATIVE
Leukocytes,Ua: NEGATIVE
Nitrite: NEGATIVE
Specific Gravity, Urine: 1.027 (ref 1.005–1.030)
pH: 6 (ref 5.0–8.0)

## 2022-06-28 LAB — RESP PANEL BY RT-PCR (RSV, FLU A&B, COVID)  RVPGX2
Influenza A by PCR: NEGATIVE
Influenza B by PCR: NEGATIVE
Resp Syncytial Virus by PCR: NEGATIVE
SARS Coronavirus 2 by RT PCR: NEGATIVE

## 2022-06-28 LAB — LIPASE, BLOOD: Lipase: 33 U/L (ref 11–51)

## 2022-06-28 MED ORDER — SODIUM CHLORIDE 0.9 % IV BOLUS
1000.0000 mL | Freq: Once | INTRAVENOUS | Status: AC
  Administered 2022-06-28: 1000 mL via INTRAVENOUS

## 2022-06-28 MED ORDER — IOHEXOL 300 MG/ML  SOLN
100.0000 mL | Freq: Once | INTRAMUSCULAR | Status: AC | PRN
  Administered 2022-06-28: 80 mL via INTRAVENOUS

## 2022-06-28 NOTE — ED Triage Notes (Signed)
Patient here POV from Home.  Endorses Generalized ABD Cramping for approximately 2 Weeks. Was in [place] and had a Solid but Green BM. Diarrhea consistently prior. Since more Regular BM 10 days ago he began to have more explosive Diarrhea.  No N/V. No Fever.   NAD Noted during Triage. A&Ox4. GCS 15. Ambulatory.

## 2022-06-28 NOTE — ED Provider Notes (Signed)
[facility] Provider Note   CSN: [id number] Arrival date & time: 06/28/22  1957     History  Chief Complaint  Patient presents with  . Abdominal Pain    Nathan Terrell is a 42 y.o. male who presents emergency department with concerns for generalized abdominal cramping x 2 weeks.  Notes that he was in the [place] where he had solid green bowel movements however he notes he has had diarrhea consistently prior to his vomit chips as well as following.  Last normal bowel movement was 10 days ago.  Has had watery nonbloody non-tarry appearing diarrhea since. Has abdominal cramping associated with bowel movements. Denies nausea, vomiting, fever, urinary symptoms.  The history is provided by the patient. No language interpreter was used.       Home Medications Prior to Admission medications   Medication Sig Start Date End Date Taking? Authorizing Provider  cephALEXin (KEFLEX) 500 MG capsule Take 1 capsule (500 mg total) by mouth 3 (three) times daily. 03/20/13   Sypher, [name], MD  HYDROmorphone (DILAUDID) 2 MG tablet Take 1 tablet (2 mg total) by mouth every 4 (four) hours as needed for severe pain. 03/20/13   Sypher, [name], MD      Allergies    Patient has no known allergies.    Review of Systems   Review of Systems  Gastrointestinal:  Positive for abdominal pain.  All other systems reviewed and are negative.   Physical Exam Updated Vital Signs BP (!) 132/93   Pulse (!) 58   Temp 98.1 F (36.7 C) (Oral)   Resp 16   Ht '5\' 10"'$  (1.778 m)   Wt 83 kg   SpO2 100%   BMI 26.26 kg/m  Physical Exam Vitals and nursing note reviewed.  Constitutional:      General: He is not in acute distress.    Appearance: He is not diaphoretic.  HENT:     Head: Normocephalic and atraumatic.     Mouth/Throat:     Pharynx: No oropharyngeal exudate.  Eyes:     General: No scleral icterus.    Conjunctiva/sclera: Conjunctivae normal.   Cardiovascular:     Rate and Rhythm: Normal rate and regular rhythm.     Pulses: Normal pulses.     Heart sounds: Normal heart sounds.  Pulmonary:     Effort: Pulmonary effort is normal. No respiratory distress.     Breath sounds: Normal breath sounds. No wheezing.  Abdominal:     General: Bowel sounds are normal.     Palpations: Abdomen is soft. There is no mass.     Tenderness: There is no abdominal tenderness. There is no guarding or rebound.  Musculoskeletal:        General: Normal range of motion.     Cervical back: Normal range of motion and neck supple.  Skin:    General: Skin is warm and dry.  Neurological:     Mental Status: He is alert.  Psychiatric:        Behavior: Behavior normal.     ED Results / Procedures / Treatments   Labs (all labs ordered are listed, but only abnormal results are displayed) Labs Reviewed  COMPREHENSIVE METABOLIC PANEL - Abnormal; Notable for the following components:      Result Value   Glucose, Bld 100 (*)    BUN 21 (*)    Creatinine, Ser 1.34 (*)    All other components within normal limits  CBC -  Abnormal; Notable for the following components:   WBC 12.0 (*)    All other components within normal limits  URINALYSIS, ROUTINE W REFLEX MICROSCOPIC - Abnormal; Notable for the following components:   Ketones, ur TRACE (*)    Protein, ur TRACE (*)    All other components within normal limits  RESP PANEL BY RT-PCR (RSV, FLU A&B, COVID)  [id number]  LIPASE, BLOOD    EKG None  Radiology CT ABDOMEN PELVIS W CONTRAST  Result Date: 06/28/2022 CLINICAL DATA:  Acute abdominal pain. EXAM: CT ABDOMEN AND PELVIS WITH CONTRAST TECHNIQUE: Multidetector CT imaging of the abdomen and pelvis was performed using the standard protocol following bolus administration of intravenous contrast. RADIATION DOSE REDUCTION: This exam was performed according to the departmental dose-optimization program which includes automated exposure control, adjustment of the  mA and/or kV according to patient size and/or use of iterative reconstruction technique. CONTRAST:  2m OMNIPAQUE IOHEXOL 300 MG/ML  SOLN COMPARISON:  None Available. FINDINGS: Lower chest: No acute abnormality. Hepatobiliary: No focal liver abnormality is seen. No gallstones, gallbladder wall thickening, or biliary dilatation. Pancreas: Unremarkable. No pancreatic ductal dilatation or surrounding inflammatory changes. Spleen: Normal in size without focal abnormality. Adrenals/Urinary Tract: Adrenal glands are unremarkable. Kidneys are normal, without renal calculi, focal lesion, or hydronephrosis. Bladder is unremarkable. Stomach/Bowel: There is short segment of colo colonic intussusception in the transverse colon. Definitive focal lesion is not seen, but there is some soft tissue fullness at the lead point. There is no evidence for bowel obstruction or inflammation. The appendix, small bowel and stomach are within normal limits. A few scattered sigmoid colon diverticula are present. Vascular/Lymphatic: No significant vascular findings are present. No enlarged abdominal or pelvic lymph nodes. Reproductive: Prostate is unremarkable. There surgical clips in the inguinal canals. Other: No abdominal wall hernia or abnormality. No abdominopelvic ascites. Musculoskeletal: No acute or significant osseous findings. IMPRESSION: 1. Short segment of colo colonic intussusception in the transverse colon. Definitive focal lesion is not seen, but there is some soft tissue fullness at the lead point. Recommend further evaluation with colonoscopy. No bowel obstruction. 2. No other acute localizing process in the abdomen or pelvis. Electronically Signed   By: ARonney AstersM.D.   On: 06/28/2022 22:47    Procedures Procedures    Medications Ordered in ED Medications  sodium chloride 0.9 % bolus 1,000 mL (1,000 mLs Intravenous New Bag/Given 06/28/22 2155)  iohexol (OMNIPAQUE) 300 MG/ML solution 100 mL (80 mLs Intravenous  Contrast Given 06/28/22 2226)    ED Course/ Medical Decision Making/ A&P Clinical Course as of 06/28/22 2354  Mon Jun 28, 2022  2245 Discussed with patient CT scan findings. Answered all available questions.  [SB]  2252 Consult to GI, Dr. MCollene Mareswho recommends discussion with radiologist and surgery. Will evaluate patient in office.  [SB]  2312 Consult with Radiologist, Dr. GGarey Hamregarding scans.  [SB]  2331 Consult with surgery, Dr. KCherlyn Robertswho recommends either repeat Noncon CT abdomen pelvis in 4 hours and if intussusception has resolved and patient can be discharged home with follow-up.  If intussusception still present patient should be admitted. [SB]  2331 Admit  [SB]  2345 Discussion with patient regarding recommendations per surgeon. Pt decides  [SB]    Clinical Course User Index [SB] Iliyana Convey A, PA-C                             Medical Decision Making Amount and/or Complexity  of Data Reviewed Labs: ordered. Radiology: ordered.  Risk Prescription drug management.   ***  Final Clinical Impression(s) / ED Diagnoses Final diagnoses:  None    Rx / DC Orders ED Discharge Orders     None

## 2022-06-29 ENCOUNTER — Encounter (HOSPITAL_COMMUNITY): Payer: Self-pay | Admitting: Family Medicine

## 2022-06-29 DIAGNOSIS — K561 Intussusception: Secondary | ICD-10-CM | POA: Diagnosis present

## 2022-06-29 DIAGNOSIS — E785 Hyperlipidemia, unspecified: Secondary | ICD-10-CM | POA: Diagnosis not present

## 2022-06-29 DIAGNOSIS — D120 Benign neoplasm of cecum: Secondary | ICD-10-CM | POA: Diagnosis not present

## 2022-06-29 DIAGNOSIS — D122 Benign neoplasm of ascending colon: Secondary | ICD-10-CM | POA: Diagnosis not present

## 2022-06-29 DIAGNOSIS — A0811 Acute gastroenteropathy due to Norwalk agent: Secondary | ICD-10-CM | POA: Diagnosis not present

## 2022-06-29 DIAGNOSIS — K635 Polyp of colon: Secondary | ICD-10-CM | POA: Diagnosis not present

## 2022-06-29 DIAGNOSIS — D124 Benign neoplasm of descending colon: Secondary | ICD-10-CM | POA: Diagnosis not present

## 2022-06-29 DIAGNOSIS — Z1152 Encounter for screening for COVID-19: Secondary | ICD-10-CM | POA: Diagnosis not present

## 2022-06-29 DIAGNOSIS — D123 Benign neoplasm of transverse colon: Secondary | ICD-10-CM | POA: Diagnosis not present

## 2022-06-29 LAB — COMPREHENSIVE METABOLIC PANEL
ALT: 18 U/L (ref 0–44)
AST: 26 U/L (ref 15–41)
Albumin: 4.1 g/dL (ref 3.5–5.0)
Alkaline Phosphatase: 51 U/L (ref 38–126)
Anion gap: 10 (ref 5–15)
BUN: 18 mg/dL (ref 6–20)
CO2: 25 mmol/L (ref 22–32)
Calcium: 8.8 mg/dL — ABNORMAL LOW (ref 8.9–10.3)
Chloride: 104 mmol/L (ref 98–111)
Creatinine, Ser: 1.21 mg/dL (ref 0.61–1.24)
GFR, Estimated: >60 mL/min (ref >60)
Glucose, Bld: 100 mg/dL — ABNORMAL HIGH (ref 70–99)
Potassium: 3.9 mmol/L (ref 3.5–5.1)
Sodium: 139 mmol/L (ref 135–145)
Total Bilirubin: 1 mg/dL (ref 0.3–1.2)
Total Protein: 6.8 g/dL (ref 6.5–8.1)

## 2022-06-29 LAB — CBC WITH DIFFERENTIAL/PLATELET
Abs Immature Granulocytes: 0.01 10*3/uL (ref 0.00–0.07)
Basophils Absolute: 0 10*3/uL (ref 0.0–0.1)
Basophils Relative: 0 %
Eosinophils Absolute: 0 10*3/uL (ref 0.0–0.5)
Eosinophils Relative: 1 %
HCT: 46.1 % (ref 39.0–52.0)
Hemoglobin: 15 g/dL (ref 13.0–17.0)
Immature Granulocytes: 0 %
Lymphocytes Relative: 34 %
Lymphs Abs: 2.2 10*3/uL (ref 0.7–4.0)
MCH: 29.6 pg (ref 26.0–34.0)
MCHC: 32.5 g/dL (ref 30.0–36.0)
MCV: 91.1 fL (ref 80.0–100.0)
Monocytes Absolute: 0.8 10*3/uL (ref 0.1–1.0)
Monocytes Relative: 13 %
Neutro Abs: 3.4 10*3/uL (ref 1.7–7.7)
Neutrophils Relative %: 52 %
Platelets: 203 10*3/uL (ref 150–400)
RBC: 5.06 MIL/uL (ref 4.22–5.81)
RDW: 13.1 % (ref 11.5–15.5)
WBC: 6.5 10*3/uL (ref 4.0–10.5)
nRBC: 0 % (ref 0.0–0.2)

## 2022-06-29 LAB — PHOSPHORUS: Phosphorus: 3 mg/dL (ref 2.5–4.6)

## 2022-06-29 LAB — MAGNESIUM: Magnesium: 2.1 mg/dL (ref 1.7–2.4)

## 2022-06-29 LAB — HIV ANTIBODY (ROUTINE TESTING W REFLEX): HIV Screen 4th Generation wRfx: NONREACTIVE

## 2022-06-29 MED ORDER — HEPARIN SODIUM (PORCINE) 5000 UNIT/ML IJ SOLN
5000.0000 [IU] | Freq: Three times a day (TID) | INTRAMUSCULAR | Status: DC
  Administered 2022-06-29 – 2022-07-01 (×4): 5000 [IU] via SUBCUTANEOUS
  Filled 2022-06-29 (×5): qty 1

## 2022-06-29 MED ORDER — ONDANSETRON HCL 4 MG/2ML IJ SOLN
4.0000 mg | Freq: Four times a day (QID) | INTRAMUSCULAR | Status: DC | PRN
  Administered 2022-06-30: 4 mg via INTRAVENOUS
  Filled 2022-06-29: qty 2

## 2022-06-29 MED ORDER — PEG 3350-KCL-NA BICARB-NACL 420 G PO SOLR
4000.0000 mL | Freq: Once | ORAL | Status: AC
  Administered 2022-06-29: 4000 mL via ORAL

## 2022-06-29 MED ORDER — ACETAMINOPHEN 325 MG PO TABS
650.0000 mg | ORAL_TABLET | Freq: Four times a day (QID) | ORAL | Status: AC | PRN
  Administered 2022-06-29: 650 mg via ORAL
  Filled 2022-06-29 (×2): qty 2

## 2022-06-29 MED ORDER — ONDANSETRON HCL 4 MG PO TABS
4.0000 mg | ORAL_TABLET | Freq: Four times a day (QID) | ORAL | Status: DC | PRN
  Filled 2022-06-29: qty 1

## 2022-06-29 MED ORDER — ALBUTEROL SULFATE (2.5 MG/3ML) 0.083% IN NEBU: 2.5000 mg | INHALATION_SOLUTION | RESPIRATORY_TRACT | Status: DC | PRN

## 2022-06-29 MED ORDER — SODIUM CHLORIDE 0.9 % IV SOLN: INTRAVENOUS | Status: AC

## 2022-06-29 NOTE — ED Notes (Signed)
Thomas/George will send transport for Bed Ready at [facility] Room# 1538.-ABB(NS)

## 2022-06-29 NOTE — ED Provider Notes (Signed)
  Physical Exam  BP 128/76   Pulse (!) 45   Temp 98.3 F (36.8 C) (Oral)   Resp 18   Ht 1.778 m (5\' 10" )   Wt 83 kg   SpO2 98%   BMI 26.26 kg/m   Physical Exam  Procedures  Procedures  ED Course / MDM   Clinical Course as of 06/29/22 0705  Mon Jun 28, 2022  2245 Discussed with patient CT scan findings. Answered all available questions.  [SB]  2252 Consult to GI, Dr. Collene Mares who recommends discussion with radiologist and surgery. Will evaluate patient in office.  [SB]  2312 Consult with Radiologist, Dr. Garey Ham regarding scans.  [SB]  2331 Consult with surgery, Dr. Cherlyn Roberts who recommends either repeat Noncon CT abdomen pelvis in 4 hours and if intussusception has resolved and patient can be discharged home with follow-up.  If intussusception still present patient should be admitted. [SB]  2345 Discussion with patient regarding recommendations per surgeon. Pt decides to be admitted at this time.  [SB]    Clinical Course User Index [SB] Blue, Soijett A, PA-C   Medical Decision Making Amount and/or Complexity of Data Reviewed Labs: ordered. Radiology: ordered.  Risk Prescription drug management. Decision regarding hospitalization.   42 yo male with nausea and vomiting concern for intussusception  Patient being admitted for further evaluation and treatment      Pattricia Boss, MD 06/30/22 1411

## 2022-06-29 NOTE — Progress Notes (Signed)
Plan of Care Note for accepted transfer   Patient: Nathan Terrell MRN: [id number]   [place]: 06/28/2022  Facility requesting transfer: [facility]   Requesting Provider: Dr. Karle Starch   Reason for transfer: Intussusception   Facility course: 42 yr old man presenting with 2 wks of abdominal cramping and watery diarrhea. He is afebrile with WBC 12,000 and SCr 1.34. CT demonstrates colo-colonic intussusception involving transverse colon without definite focal lesion.   ED PA consulted GI (Dr. Collene Mares) and surgery (Dr. Kieth Brightly) who recommended NPO and medical admission. He was given 1 liter of NS in ED.   Plan of care: The patient is accepted for admission to [place]  unit, at [facility].   Author: Vianne Bulls, MD 06/29/2022  Check [email or web address] for on-call coverage.  Nursing staff, Please call [facility] number on Amion as soon as patient's arrival, so appropriate admitting provider can evaluate the pt.

## 2022-06-29 NOTE — Consult Note (Signed)
Reason for Consult: Intussusception Referring Physician: [facility]  Lendell Caprice HPI: This is a 42 year old Terrell without any significant PMH admitted for an intussusception.  His symptoms started acutely 3 weeks ago.  He thought that he ate something that did not agree with him, but the symptoms continued to persist.  He was able to go on vacation with his wife and he had a 24-48 hours of reprieve from his abdominal pain symptoms.  The discomfort immediately passed and he was able to pass a couple of formed bowel movements.  Unfortunately his symptoms acutely returned and it continued to persist.  He was recently in [place] with friends and he ate and drank beer, which he felt worsened his symptoms.  Upon his return he presented to the ER and the intussusception was identified.  The patient denied any issues with nausea, vomiting, hematochezia, or melena.  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 No pertinent family history.  Social History:  reports that he has never smoked. He has never used smokeless tobacco. He reports current alcohol use. He reports current drug use. Drug: Marijuana.  Allergies: No Known Allergies  Medications: Scheduled:  heparin  5,000 Units Subcutaneous Q8H   polyethylene glycol-electrolytes  4,000 mL Oral Once   Continuous:  sodium chloride 100 mL/hr at 06/29/22 1151    Results  for orders placed or performed during the hospital encounter of 06/28/22 (from the past 24 hour(s))  Lipase, blood     Status: None   Collection Time: 06/28/22  8:06 PM  Result Value Ref Range   Lipase 33 11 - 51 U/L  Comprehensive metabolic panel     Status: Abnormal   Collection Time: 06/28/22  8:06 PM  Result Value Ref Range   Sodium 141 135 - 145 mmol/L   Potassium 3.9 3.5 - 5.1 mmol/L   Chloride 102 98 - 111 mmol/L   CO2 28 22 - 32 mmol/L   Glucose, Bld 100 (H) Nathan - 99 mg/dL   BUN 21 (H) 6 - 20 mg/dL   Creatinine, Ser 1.34 (H) 0.61 - 1.24 mg/dL   Calcium 9.8 8.9 - 10.3 mg/dL   Total Protein 7.3 6.5 - 8.1 g/dL   Albumin 4.7 3.5 - 5.0 g/dL   AST 23 15 - 41 U/L   ALT 17 0 - 44 U/L   Alkaline Phosphatase 56 38 - 126 U/L   Total Bilirubin 0.5 0.3 - 1.2 mg/dL   GFR, Estimated >60 >60 mL/min   Anion gap 11 5 - 15  CBC     Status: Abnormal   Collection Time: 06/28/22  8:06 PM  Result Value Ref Range   WBC 12.0 (H) 4.0 - 10.5 K/uL   RBC 5.32 4.22 - 5.81 MIL/uL   Hemoglobin 15.8 13.0 - 17.0 g/dL   HCT 46.8 39.0 - 52.0 %   MCV 88.0 80.0 - 100.0 fL   MCH 29.7  26.0 - 34.0 pg   MCHC 33.8 30.0 - 36.0 g/dL   RDW 13.2 11.5 - 15.5 %   Platelets 231 150 - 400 K/uL   nRBC 0.0 0.0 - 0.2 %  Urinalysis, Routine w reflex microscopic -Urine, Clean Catch     Status: Abnormal   Collection Time: 06/28/22  8:06 PM  Result Value Ref Range   Color, Urine YELLOW YELLOW   APPearance CLEAR CLEAR   Specific Gravity, Urine 1.027 1.005 - 1.030   pH 6.0 5.0 - 8.0   Glucose, UA NEGATIVE NEGATIVE mg/dL   Hgb urine dipstick NEGATIVE NEGATIVE   Bilirubin Urine NEGATIVE NEGATIVE   Ketones, ur TRACE (A) NEGATIVE mg/dL   Protein, ur TRACE (A) NEGATIVE mg/dL   Nitrite NEGATIVE NEGATIVE   Leukocytes,Ua NEGATIVE NEGATIVE  Resp panel by RT-PCR (RSV, Flu A&B, Covid) Anterior Nasal Swab     Status: None   Collection Time: 06/28/22  9:55 PM   Specimen: Anterior Nasal Swab  Result Value Ref Range   SARS  Coronavirus 2 by RT PCR NEGATIVE NEGATIVE   Influenza A by PCR NEGATIVE NEGATIVE   Influenza B by PCR NEGATIVE NEGATIVE   Resp Syncytial Virus by PCR NEGATIVE NEGATIVE  HIV Antibody (routine testing w rflx)     Status: None   Collection Time: 06/29/22 11:50 AM  Result Value Ref Range   HIV Screen 4th Generation wRfx Non Reactive Non Reactive  Comprehensive metabolic panel     Status: Abnormal   Collection Time: 06/29/22 11:50 AM  Result Value Ref Range   Sodium 139 135 - 145 mmol/L   Potassium 3.9 3.5 - 5.1 mmol/L   Chloride 104 98 - 111 mmol/L   CO2 25 22 - 32 mmol/L   Glucose, Bld 100 (H) Nathan - 99 mg/dL   BUN 18 6 - 20 mg/dL   Creatinine, Ser 1.21 0.61 - 1.24 mg/dL   Calcium 8.8 (L) 8.9 - 10.3 mg/dL   Total Protein 6.8 6.5 - 8.1 g/dL   Albumin 4.1 3.5 - 5.0 g/dL   AST 26 15 - 41 U/L   ALT 18 0 - 44 U/L   Alkaline Phosphatase 51 38 - 126 U/L   Total Bilirubin 1.0 0.3 - 1.2 mg/dL   GFR, Estimated >60 >60 mL/min   Anion gap 10 5 - 15  Magnesium     Status: None   Collection Time: 06/29/22 11:50 AM  Result Value Ref Range   Magnesium 2.1 1.7 - 2.4 mg/dL  Phosphorus     Status: None   Collection Time: 06/29/22 11:50 AM  Result Value Ref Range   Phosphorus 3.0 2.5 - 4.6 mg/dL  CBC with Differential/Platelet     Status: None   Collection Time: 06/29/22 11:50 AM  Result Value Ref Range   WBC 6.5 4.0 - 10.5 K/uL   RBC 5.06 4.22 - 5.81 MIL/uL   Hemoglobin 15.0 13.0 - 17.0 g/dL   HCT 46.1 39.0 - 52.0 %   MCV 91.1 80.0 - 100.0 fL   MCH 29.6 26.0 - 34.0 pg   MCHC 32.5 30.0 - 36.0 g/dL   RDW 13.1 11.5 - 15.5 %   Platelets 203 150 - 400 K/uL   nRBC 0.0 0.0 - 0.2 %   Neutrophils Relative % 52 %   Neutro Abs 3.4 1.7 - 7.7 K/uL   Lymphocytes Relative 34 %   Lymphs Abs 2.2 0.7 - 4.0 K/uL   Monocytes Relative 13 %   Monocytes  Absolute 0.8 0.1 - 1.0 K/uL   Eosinophils Relative 1 %   Eosinophils Absolute 0.0 0.0 - 0.5 K/uL   Basophils Relative 0 %   Basophils Absolute 0.0 0.0 -  0.1 K/uL   Immature Granulocytes 0 %   Abs Immature Granulocytes 0.01 0.00 - 0.07 K/uL     CT ABDOMEN PELVIS W CONTRAST  Result Date: 06/28/2022 CLINICAL DATA:  Acute abdominal pain. EXAM: CT ABDOMEN AND PELVIS WITH CONTRAST TECHNIQUE: Multidetector CT imaging of the abdomen and pelvis was performed using the standard protocol following bolus administration of intravenous contrast. RADIATION DOSE REDUCTION: This exam was performed according to the departmental dose-optimization program which includes automated exposure control, adjustment of the mA and/or kV according to patient size and/or use of iterative reconstruction technique. CONTRAST:  44m OMNIPAQUE IOHEXOL 300 MG/ML  SOLN COMPARISON:  None Available. FINDINGS: Lower chest: No acute abnormality. Hepatobiliary: No focal liver abnormality is seen. No gallstones, gallbladder wall thickening, or biliary dilatation. Pancreas: Unremarkable. No pancreatic ductal dilatation or surrounding inflammatory changes. Spleen: Normal in size without focal abnormality. Adrenals/Urinary Tract: Adrenal glands are unremarkable. Kidneys are normal, without renal calculi, focal lesion, or hydronephrosis. Bladder is unremarkable. Stomach/Bowel: There is short segment of colo colonic intussusception in the transverse colon. Definitive focal lesion is not seen, but there is some soft tissue fullness at the lead point. There is no evidence for bowel obstruction or inflammation. The appendix, small bowel and stomach are within normal limits. A few scattered sigmoid colon diverticula are present. Vascular/Lymphatic: No significant vascular findings are present. No enlarged abdominal or pelvic lymph nodes. Reproductive: Prostate is unremarkable. There surgical clips in the inguinal canals. Other: No abdominal wall hernia or abnormality. No abdominopelvic ascites. Musculoskeletal: No acute or significant osseous findings. IMPRESSION: 1. Short segment of colo colonic  intussusception in the transverse colon. Definitive focal lesion is not seen, but there is some soft tissue fullness at the lead point. Recommend further evaluation with colonoscopy. No bowel obstruction. 2. No other acute localizing process in the abdomen or pelvis. Electronically Signed   By: ARonney AstersM.D.   On: 06/28/2022 22:47    ROS:  As stated above in the HPI otherwise negative.  Blood pressure (!) 146/77, pulse (!) 57, temperature 98.4 F (36.9 C), temperature source Oral, resp. rate 18, height '5\' 10"'$  (1.778 m), weight 83 kg, SpO2 99 %.    PE: Gen: NAD, Alert and Oriented HEENT:  [place]/AT, EOMI Neck: Supple, no LAD Lungs: CTA Bilaterally CV: RRR without M/G/R ABD: Soft, NTND, +BS Ext: No C/C/E  Assessment/Plan: 1) Intussusception. 2) Abdominal pain. 3) Abnormal CT scan.   Further evaluation with a colonoscopy is required.  Pending the results further recommendations will be made.  If a clear lead point is identified the area will be tattooed with SPOT.  Plan: 1) Colonoscopy tomorrow.  Ameya Vowell D 06/29/2022, 5:54 PM

## 2022-06-29 NOTE — H&P (Signed)
History and Physical    Nathan Terrell [id number] DOB: 1980/06/06 DOA: 06/28/2022  PCP: Maury Dus, Terrell (Inactive)  Patient coming [facility]: [facility]  I have personally briefly reviewed patient's old medical records in [place]  Chief Complaint: abdominal pain x 2 weeks HPI: Nathan Terrell is a 42 y.o. male with medical history significant of DJD of shoulder who presents to ED with complaints of  abdominal pain  and diarrhea persistent/progressive x 2 weeks.Per  chart patient noted symptoms started prior to trip ,however while in [place] 2 weeks go he noted continued persistent and green stool. He now notes diarrhea  and describes his it as explosive. Per notes he described no n/v/ or fever associated with these symptoms.    ED Course:  Patient vitals  Afeb, bp 156/102, hr 68, rr 20, sat 100% on ra  Wbc 12, hgb 15.8,  plt 231,  UA trace ketones trace protein  NA 141/ k 3.9, cr 1.34( no base to compare) Respiratory panel neg   IMPRESSION: 1. Short segment of colo colonic intussusception in the transverse colon. Definitive focal lesion is not seen, but there is some soft tissue fullness at the lead point. Recommend further evaluation with colonoscopy. No bowel obstruction. 2. No other acute localizing process in the abdomen or pelvis. Tx: 1L Ns ED PA consulted GI (Dr. Collene Mares) and surgery (Dr. Kieth Brightly) who recommended NPO and medical admission. He was given 1 liter of NS in ED.    Review of Systems: As per HPI otherwise 10 point review of systems negative.   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 He has never used smokeless tobacco. He reports current alcohol use. He reports current drug use. Drug: Marijuana.  No Known Allergies  History reviewed. No pertinent family history.  Prior to Admission medications   Medication Sig Start Date End Date Taking? Authorizing Provider  cephALEXin (KEFLEX) 500 MG capsule Take 1 capsule (500 mg total) by mouth 3 (three) times daily. Patient not taking: Reported on 06/29/2022 03/20/13   Sypher, Nathan Terrell  HYDROmorphone (DILAUDID) 2 MG tablet Take 1 tablet (2 mg total) by mouth every 4 (four) hours as needed for severe pain. Patient not taking: Reported on 06/29/2022 03/20/13   Nathan Sato, Terrell    Physical Exam: Vitals:   06/29/22 0600 06/29/22 0800 06/29/22 0903 06/29/22 1050  BP: 128/76 (!) 136/92 (!) 162/88 (!) 147/90  Pulse: (!) 45 62 75 (!) 55  Resp: '18  18 17  '$ Temp:   98.6 F (37 C) 97.6 F (36.4 C)  TempSrc:   Oral   SpO2: 98% 97% 97% 99%  Weight:      Height:        Constitutional: NAD, calm, comfortable Vitals:   06/29/22 0600 06/29/22 0800 06/29/22 0903 06/29/22 1050  BP: 128/76 (!) 136/92 (!) 162/88 (!) 147/90  Pulse: (!) 45 62  75 (!) 55  Resp: '18  18 17  '$ Temp:   98.6 F (37 C) 97.6 F (36.4 C)  TempSrc:   Oral   SpO2: 98% 97% 97% 99%  Weight:      Height:       PE Deferred     Labs on Admission: I have personally reviewed following labs and imaging studies  CBC: Recent Labs  Lab 06/28/22 2006  WBC 12.0*  HGB 15.8  HCT 46.8  MCV 88.0  PLT [id number]   Basic Metabolic Panel: Recent Labs  Lab 06/28/22 2006  NA 141  K 3.9  CL 102  CO2 28  GLUCOSE 100*  BUN 21*  CREATININE 1.34*  CALCIUM 9.8   GFR: Estimated Creatinine Clearance: 73.4 mL/min (A) (by C-G  formula based on SCr of 1.34 mg/dL (H)). Liver Function Tests: Recent Labs  Lab 06/28/22 2006  AST 23  ALT 17  ALKPHOS 56  BILITOT 0.5  PROT 7.3  ALBUMIN 4.7   Recent Labs  Lab 06/28/22 2006  LIPASE 33   No results for input(s): "AMMONIA" in the last 168 hours. Coagulation Profile: No results for input(s): "INR", "PROTIME" in the last 168 hours. Cardiac Enzymes: No results for input(s): "CKTOTAL", "CKMB", "CKMBINDEX", "TROPONINI" in the last 168 hours. BNP (last 3 results) No results for input(s): "PROBNP" in the last 8760 hours. HbA1C: No results for input(s): "HGBA1C" in the last 72 hours. CBG: No results for input(s): "GLUCAP" in the last 168 hours. Lipid Profile: No results for input(s): "CHOL", "HDL", "LDLCALC", "TRIG", "CHOLHDL", "LDLDIRECT" in the last 72 hours. Thyroid Function Tests: No results for input(s): "TSH", "T4TOTAL", "FREET4", "T3FREE", "THYROIDAB" in the last 72 hours. Anemia Panel: No results for input(s): "VITAMINB12", "FOLATE", "FERRITIN", "TIBC", "IRON", "RETICCTPCT" in the last 72 hours. Urine analysis:    Component Value Date/Time   COLORURINE YELLOW 06/28/2022 2006   APPEARANCEUR CLEAR 06/28/2022 2006   LABSPEC 1.027 06/28/2022 2006   PHURINE 6.0 06/28/2022 2006   GLUCOSEU NEGATIVE 06/28/2022 2006   HGBUR NEGATIVE 06/28/2022 2006   BILIRUBINUR NEGATIVE 06/28/2022 2006   [place] (A) 06/28/2022 2006   PROTEINUR TRACE (A) 06/28/2022 2006   NITRITE NEGATIVE 06/28/2022 2006   LEUKOCYTESUR NEGATIVE 06/28/2022 2006    Radiological Exams on Admission: CT ABDOMEN PELVIS W CONTRAST  Result Date: 06/28/2022 CLINICAL DATA:  Acute abdominal pain. EXAM: CT ABDOMEN AND PELVIS WITH CONTRAST TECHNIQUE: Multidetector CT imaging of the abdomen and pelvis was performed using the standard protocol following bolus administration of intravenous contrast. RADIATION DOSE REDUCTION: This exam was performed according to the departmental dose-optimization  program which includes automated exposure control, adjustment of the mA and/or kV according to patient size and/or use of iterative reconstruction technique. CONTRAST:  45m OMNIPAQUE IOHEXOL 300 MG/ML  SOLN COMPARISON:  None Available. FINDINGS: Lower chest: No acute abnormality. Hepatobiliary: No focal liver abnormality is seen. No gallstones, gallbladder wall thickening, or biliary dilatation. Pancreas: Unremarkable. No pancreatic ductal dilatation or surrounding inflammatory changes. Spleen: Normal in size without focal abnormality. Adrenals/Urinary Tract: Adrenal glands are unremarkable. Kidneys are normal, without renal calculi, focal lesion, or hydronephrosis. Bladder is unremarkable. Stomach/Bowel: There is short segment of colo colonic intussusception in the transverse colon. Definitive focal lesion is not seen, but there is some soft tissue fullness at the lead point. There is no evidence for bowel obstruction or inflammation. The appendix, small bowel and stomach are within normal limits. A few scattered sigmoid colon diverticula are present. Vascular/Lymphatic: No significant vascular  findings are present. No enlarged abdominal or pelvic lymph nodes. Reproductive: Prostate is unremarkable. There surgical clips in the inguinal canals. Other: No abdominal wall hernia or abnormality. No abdominopelvic ascites. Musculoskeletal: No acute or significant osseous findings. IMPRESSION: 1. Short segment of colo colonic intussusception in the transverse colon. Definitive focal lesion is not seen, but there is some soft tissue fullness at the lead point. Recommend further evaluation with colonoscopy. No bowel obstruction. 2. No other acute localizing process in the abdomen or pelvis. Electronically Signed   By: Ronney Asters M.D.   On: 06/28/2022 22:47    EKG: Independently reviewed.   Assessment/Plan    Colonic intussusception  -admit to med tele  -npo -await /Gi/ surgery consult recs  -supportive care  with ivfs - stool studies to be complete  -monitor electrolytes and replete  DVT prophylaxis: heparin Code Status: full/ as discussed per patient wishes in event of cardiac arrest  Family Communication: none at bedside Disposition Plan: patient  expected to be admitted greater than 2 midnights  Consults called: Gi- Dr Collene Mares,  Surgery Dr Kieth Brightly Admission status:  med/tele   Clance Boll Terrell [facility]   If 7PM-7AM, please contact night-coverage [email or web address] Password [facility] [place]  06/29/2022, 10:57 AM

## 2022-06-29 NOTE — H&P (View-Only) (Signed)
Reason for Consult: Intussusception Referring Physician: [facility]  Nathan Terrell HPI: This is a 42 year old male without any significant PMH admitted for an intussusception.  His symptoms started acutely 3 weeks ago.  He thought that he ate something that did not agree with him, but the symptoms continued to persist.  He was able to go on vacation with his wife and he had a 24-48 hours of reprieve from his abdominal pain symptoms.  The discomfort immediately passed and he was able to pass a couple of formed bowel movements.  Unfortunately his symptoms acutely returned and it continued to persist.  He was recently in [place] with friends and he ate and drank beer, which he felt worsened his symptoms.  Upon his return he presented to the ER and the intussusception was identified.  The patient denied any issues with nausea, vomiting, hematochezia, or melena.  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 No pertinent family history.  Social History:  reports that he has never smoked. He has never used smokeless tobacco. He reports current alcohol use. He reports current drug use. Drug: Marijuana.  Allergies: No Known Allergies  Medications: Scheduled:  heparin  5,000 Units Subcutaneous Q8H   polyethylene glycol-electrolytes  4,000 mL Oral Once   Continuous:  sodium chloride 100 mL/hr at 06/29/22 1151    Results  for orders placed or performed during the hospital encounter of 06/28/22 (from the past 24 hour(s))  Lipase, blood     Status: None   Collection Time: 06/28/22  8:06 PM  Result Value Ref Range   Lipase 33 11 - 51 U/L  Comprehensive metabolic panel     Status: Abnormal   Collection Time: 06/28/22  8:06 PM  Result Value Ref Range   Sodium 141 135 - 145 mmol/L   Potassium 3.9 3.5 - 5.1 mmol/L   Chloride 102 98 - 111 mmol/L   CO2 28 22 - 32 mmol/L   Glucose, Bld 100 (H) 70 - 99 mg/dL   BUN 21 (H) 6 - 20 mg/dL   Creatinine, Ser 1.34 (H) 0.61 - 1.24 mg/dL   Calcium 9.8 8.9 - 10.3 mg/dL   Total Protein 7.3 6.5 - 8.1 g/dL   Albumin 4.7 3.5 - 5.0 g/dL   AST 23 15 - 41 U/L   ALT 17 0 - 44 U/L   Alkaline Phosphatase 56 38 - 126 U/L   Total Bilirubin 0.5 0.3 - 1.2 mg/dL   GFR, Estimated >60 >60 mL/min   Anion gap 11 5 - 15  CBC     Status: Abnormal   Collection Time: 06/28/22  8:06 PM  Result Value Ref Range   WBC 12.0 (H) 4.0 - 10.5 K/uL   RBC 5.32 4.22 - 5.81 MIL/uL   Hemoglobin 15.8 13.0 - 17.0 g/dL   HCT 46.8 39.0 - 52.0 %   MCV 88.0 80.0 - 100.0 fL   MCH 29.7  26.0 - 34.0 pg   MCHC 33.8 30.0 - 36.0 g/dL   RDW 13.2 11.5 - 15.5 %   Platelets 231 150 - 400 K/uL   nRBC 0.0 0.0 - 0.2 %  Urinalysis, Routine w reflex microscopic -Urine, Clean Catch     Status: Abnormal   Collection Time: 06/28/22  8:06 PM  Result Value Ref Range   Color, Urine YELLOW YELLOW   APPearance CLEAR CLEAR   Specific Gravity, Urine 1.027 1.005 - 1.030   pH 6.0 5.0 - 8.0   Glucose, UA NEGATIVE NEGATIVE mg/dL   Hgb urine dipstick NEGATIVE NEGATIVE   Bilirubin Urine NEGATIVE NEGATIVE   Ketones, ur TRACE (A) NEGATIVE mg/dL   Protein, ur TRACE (A) NEGATIVE mg/dL   Nitrite NEGATIVE NEGATIVE   Leukocytes,Ua NEGATIVE NEGATIVE  Resp panel by RT-PCR (RSV, Flu A&B, Covid) Anterior Nasal Swab     Status: None   Collection Time: 06/28/22  9:55 PM   Specimen: Anterior Nasal Swab  Result Value Ref Range   SARS  Coronavirus 2 by RT PCR NEGATIVE NEGATIVE   Influenza A by PCR NEGATIVE NEGATIVE   Influenza B by PCR NEGATIVE NEGATIVE   Resp Syncytial Virus by PCR NEGATIVE NEGATIVE  HIV Antibody (routine testing w rflx)     Status: None   Collection Time: 06/29/22 11:50 AM  Result Value Ref Range   HIV Screen 4th Generation wRfx Non Reactive Non Reactive  Comprehensive metabolic panel     Status: Abnormal   Collection Time: 06/29/22 11:50 AM  Result Value Ref Range   Sodium 139 135 - 145 mmol/L   Potassium 3.9 3.5 - 5.1 mmol/L   Chloride 104 98 - 111 mmol/L   CO2 25 22 - 32 mmol/L   Glucose, Bld 100 (H) 70 - 99 mg/dL   BUN 18 6 - 20 mg/dL   Creatinine, Ser 1.21 0.61 - 1.24 mg/dL   Calcium 8.8 (L) 8.9 - 10.3 mg/dL   Total Protein 6.8 6.5 - 8.1 g/dL   Albumin 4.1 3.5 - 5.0 g/dL   AST 26 15 - 41 U/L   ALT 18 0 - 44 U/L   Alkaline Phosphatase 51 38 - 126 U/L   Total Bilirubin 1.0 0.3 - 1.2 mg/dL   GFR, Estimated >60 >60 mL/min   Anion gap 10 5 - 15  Magnesium     Status: None   Collection Time: 06/29/22 11:50 AM  Result Value Ref Range   Magnesium 2.1 1.7 - 2.4 mg/dL  Phosphorus     Status: None   Collection Time: 06/29/22 11:50 AM  Result Value Ref Range   Phosphorus 3.0 2.5 - 4.6 mg/dL  CBC with Differential/Platelet     Status: None   Collection Time: 06/29/22 11:50 AM  Result Value Ref Range   WBC 6.5 4.0 - 10.5 K/uL   RBC 5.06 4.22 - 5.81 MIL/uL   Hemoglobin 15.0 13.0 - 17.0 g/dL   HCT 46.1 39.0 - 52.0 %   MCV 91.1 80.0 - 100.0 fL   MCH 29.6 26.0 - 34.0 pg   MCHC 32.5 30.0 - 36.0 g/dL   RDW 13.1 11.5 - 15.5 %   Platelets 203 150 - 400 K/uL   nRBC 0.0 0.0 - 0.2 %   Neutrophils Relative % 52 %   Neutro Abs 3.4 1.7 - 7.7 K/uL   Lymphocytes Relative 34 %   Lymphs Abs 2.2 0.7 - 4.0 K/uL   Monocytes Relative 13 %   Monocytes  Absolute 0.8 0.1 - 1.0 K/uL   Eosinophils Relative 1 %   Eosinophils Absolute 0.0 0.0 - 0.5 K/uL   Basophils Relative 0 %   Basophils Absolute 0.0 0.0 -  0.1 K/uL   Immature Granulocytes 0 %   Abs Immature Granulocytes 0.01 0.00 - 0.07 K/uL     CT ABDOMEN PELVIS W CONTRAST  Result Date: 06/28/2022 CLINICAL DATA:  Acute abdominal pain. EXAM: CT ABDOMEN AND PELVIS WITH CONTRAST TECHNIQUE: Multidetector CT imaging of the abdomen and pelvis was performed using the standard protocol following bolus administration of intravenous contrast. RADIATION DOSE REDUCTION: This exam was performed according to the departmental dose-optimization program which includes automated exposure control, adjustment of the mA and/or kV according to patient size and/or use of iterative reconstruction technique. CONTRAST:  65m OMNIPAQUE IOHEXOL 300 MG/ML  SOLN COMPARISON:  None Available. FINDINGS: Lower chest: No acute abnormality. Hepatobiliary: No focal liver abnormality is seen. No gallstones, gallbladder wall thickening, or biliary dilatation. Pancreas: Unremarkable. No pancreatic ductal dilatation or surrounding inflammatory changes. Spleen: Normal in size without focal abnormality. Adrenals/Urinary Tract: Adrenal glands are unremarkable. Kidneys are normal, without renal calculi, focal lesion, or hydronephrosis. Bladder is unremarkable. Stomach/Bowel: There is short segment of colo colonic intussusception in the transverse colon. Definitive focal lesion is not seen, but there is some soft tissue fullness at the lead point. There is no evidence for bowel obstruction or inflammation. The appendix, small bowel and stomach are within normal limits. A few scattered sigmoid colon diverticula are present. Vascular/Lymphatic: No significant vascular findings are present. No enlarged abdominal or pelvic lymph nodes. Reproductive: Prostate is unremarkable. There surgical clips in the inguinal canals. Other: No abdominal wall hernia or abnormality. No abdominopelvic ascites. Musculoskeletal: No acute or significant osseous findings. IMPRESSION: 1. Short segment of colo colonic  intussusception in the transverse colon. Definitive focal lesion is not seen, but there is some soft tissue fullness at the lead point. Recommend further evaluation with colonoscopy. No bowel obstruction. 2. No other acute localizing process in the abdomen or pelvis. Electronically Signed   By: ARonney AstersM.D.   On: 06/28/2022 22:47    ROS:  As stated above in the HPI otherwise negative.  Blood pressure (!) 146/77, pulse (!) 57, temperature 98.4 F (36.9 C), temperature source Oral, resp. rate 18, height '5\' 10"'$  (1.778 m), weight 83 kg, SpO2 99 %.    PE: Gen: NAD, Alert and Oriented HEENT:  [place]/AT, EOMI Neck: Supple, no LAD Lungs: CTA Bilaterally CV: RRR without M/G/R ABD: Soft, NTND, +BS Ext: No C/C/E  Assessment/Plan: 1) Intussusception. 2) Abdominal pain. 3) Abnormal CT scan.   Further evaluation with a colonoscopy is required.  Pending the results further recommendations will be made.  If a clear lead point is identified the area will be tattooed with SPOT.  Plan: 1) Colonoscopy tomorrow.  Yittel Emrich D 06/29/2022, 5:54 PM

## 2022-06-29 NOTE — Consult Note (Addendum)
Consult Note  Nathan Terrell February 12, 1981  [id number].    Requesting MD: Dr. Marcello Moores Chief Complaint/Reason for Consult: colo-colonic intussusception   HPI:  42 y.o. male with medical history significant for hyperlipidemia who presented to [facility] ED with abdominal pain on 06/28/2022.  Pain began approximately 3 weeks ago and he describes it as generalized cramping with associated diarrhea. Within these 3 weeks he has traveled to the [place] at which time stool became solid green for a few days. Otherwise has remained diarrhea. He noted some transient improvement with pepto bismol. He travelled to [place] for work recently and since symptoms were still present there and when he arrived home he decided to come to the ED for further evaluation.  He denies black stools or hematochezia. He denies nausea, emesis, fever chills. He has been NPO since presentation to the ED but otherwise has been eating and drinking a regular diet. He has never experienced episodes like this before and otherwise he has regular bowel movements with normal stool.  He has never had a colonoscopy.  He denies family history of colon cancer or other GI disorders or cancers.  Wife is at bedside  Substance use: Occasional marijuana and alcohol use Allergies: NKDA Blood thinners: none Past Surgeries: none  ROS: ROS reviewed and negative except as above.  History reviewed. No pertinent family history.  Past Medical History:  Diagnosis Date   Cough 03/12/2013   DJD (degenerative joint disease) 02/2013   right shoulder (acromial)   SLAP lesion, type II 02/2013   right    Past Surgical History:  Procedure Laterality Date   NO PAST SURGERIES     SHOULDER ARTHROSCOPY WITH SUBACROMIAL DECOMPRESSION, ROTATOR CUFF REPAIR AND BICEP TENDON REPAIR Right 03/20/2013   Procedure: RIGHT SHOULDER ARTHROSCOPY WITH SUBACROMIAL DECOMPRESSION, DISTAL CLAVICLE RESECTION, OPEN BICEPS TENODESIS;  Surgeon: Cammie Sickle., MD;  Location: [place];  Service: Orthopedics;  Laterality: Right;    Social History:  reports that he has never smoked. He has never used smokeless tobacco. He reports current alcohol use. He reports current drug use. Drug: Marijuana.  Allergies: No Known Allergies  Medications Prior to Admission  Medication Sig Dispense Refill   cephALEXin (KEFLEX) 500 MG capsule Take 1 capsule (500 mg total) by mouth 3 (three) times daily. (Patient not taking: Reported on 06/29/2022) 12 capsule 0   HYDROmorphone (DILAUDID) 2 MG tablet Take 1 tablet (2 mg total) by mouth every 4 (four) hours as needed for severe pain. (Patient not taking: Reported on 06/29/2022) 30 tablet 0    Blood pressure (!) 147/90, pulse (!) 55, temperature 97.6 F (36.4 C), resp. rate 17, height '5\' 10"'$  (1.778 m), weight 83 kg, SpO2 99 %. Physical Exam: General: pleasant, WD, male who is laying in bed in NAD HEENT: head is normocephalic, atraumatic.  Sclera are noninjected.  Pupils equal and round. EOMs intact.  Ears and nose without any masses or lesions.  Mouth is pink and moist Heart: regular, rate, and rhythm.  Normal s1,s2. No obvious murmurs, gallops, or rubs noted.  Palpable radial and pedal pulses bilaterally Lungs: CTAB, no wheezes, rhonchi, or rales noted.  Respiratory effort nonlabored Abd: soft, ND, +BS, no masses, hernias, or organomegaly. Minimal TTP across lower abdomen without rebound or guarding  MSK: all 4 extremities are symmetrical with no cyanosis, clubbing, or edema. Skin: warm and dry with no masses, lesions, or rashes Neuro: Cranial nerves 2-12 grossly intact, sensation is normal  throughout Psych: A&Ox3 with an appropriate affect.    Results for orders placed or performed during the hospital encounter of 06/28/22 (from the past 48 hour(s))  Lipase, blood     Status: None   Collection Time: 06/28/22  8:06 PM  Result Value Ref Range   Lipase 33 11 - 51 U/L    Comment: Performed at [facility], [address] [place], [place] [address]  Comprehensive metabolic panel     Status: Abnormal   Collection Time: 06/28/22  8:06 PM  Result Value Ref Range   Sodium 141 135 - 145 mmol/L   Potassium 3.9 3.5 - 5.1 mmol/L   Chloride 102 98 - 111 mmol/L   CO2 28 22 - 32 mmol/L   Glucose, Bld 100 (H) 70 - 99 mg/dL    Comment: Glucose reference range applies only to samples taken after fasting for at least 8 hours.   BUN 21 (H) 6 - 20 mg/dL   Creatinine, Ser 1.34 (H) 0.61 - 1.24 mg/dL   Calcium 9.8 8.9 - 10.3 mg/dL   Total Protein 7.3 6.5 - 8.1 g/dL   Albumin 4.7 3.5 - 5.0 g/dL   AST 23 15 - 41 U/L   ALT 17 0 - 44 U/L   Alkaline Phosphatase 56 38 - 126 U/L   Total Bilirubin 0.5 0.3 - 1.2 mg/dL   GFR, Estimated >60 >60 mL/min    Comment: (NOTE) Calculated using the CKD-EPI Creatinine Equation (2021)    Anion gap 11 5 - 15    Comment: Performed at [facility], [address], [place], [place] [address]  CBC     Status: Abnormal   Collection Time: 06/28/22  8:06 PM  Result Value Ref Range   WBC 12.0 (H) 4.0 - 10.5 K/uL   RBC 5.32 4.22 - 5.81 MIL/uL   Hemoglobin 15.8 13.0 - 17.0 g/dL   HCT 46.8 39.0 - 52.0 %   MCV 88.0 80.0 - 100.0 fL   MCH 29.7 26.0 - 34.0 pg   MCHC 33.8 30.0 - 36.0 g/dL   RDW 13.2 11.5 - 15.5 %   Platelets 231 150 - 400 K/uL   nRBC 0.0 0.0 - 0.2 %    Comment: Performed at [facility], [address] [place], [place] [address]  Urinalysis, Routine w reflex microscopic -Urine, Clean Catch     Status: Abnormal   Collection Time: 06/28/22  8:06 PM  Result Value Ref Range   Color, Urine YELLOW YELLOW   APPearance CLEAR CLEAR   Specific Gravity, Urine 1.027 1.005 - 1.030   pH 6.0 5.0 - 8.0   Glucose, UA NEGATIVE NEGATIVE mg/dL   Hgb urine dipstick NEGATIVE NEGATIVE   Bilirubin Urine NEGATIVE NEGATIVE   Ketones, ur TRACE (A) NEGATIVE mg/dL   Protein, ur TRACE (A) NEGATIVE mg/dL   Nitrite  NEGATIVE NEGATIVE   Leukocytes,Ua NEGATIVE NEGATIVE    Comment: Performed at [facility], [address] [place], [place] [address]  Resp panel by RT-PCR (RSV, Flu A&B, Covid) Anterior Nasal Swab     Status: None   Collection Time: 06/28/22  9:55 PM   Specimen: Anterior Nasal Swab  Result Value Ref Range   SARS Coronavirus 2 by RT PCR NEGATIVE NEGATIVE    Comment: (NOTE) SARS-CoV-2 target nucleic acids are NOT DETECTED.  The SARS-CoV-2 RNA is generally detectable in upper respiratory specimens during the acute phase of infection. The lowest concentration of SARS-CoV-2 viral copies this  assay can detect is 138 copies/mL. A negative result does not preclude SARS-Cov-2 infection and should not be used as the sole basis for treatment or other patient management decisions. A negative result may occur with  improper specimen collection/handling, submission of specimen other than nasopharyngeal swab, presence of viral mutation(s) within the areas targeted by this assay, and inadequate number of viral copies(<138 copies/mL). A negative result must be combined with clinical observations, patient history, and epidemiological information. The expected result is Negative.  Fact Sheet for Patients:  EntrepreneurPulse.com.au  Fact Sheet for Healthcare Providers:  IncredibleEmployment.be  This test is no t yet approved or cleared by the [place] FDA and  has been authorized for detection and/or diagnosis of SARS-CoV-2 by FDA under an Emergency Use Authorization (EUA). This EUA will remain  in effect (meaning this test can be used) for the duration of the COVID-19 declaration under Section 564(b)(1) of the Act, 21 U.S.C.section 360bbb-3(b)(1), unless the authorization is terminated  or revoked sooner.       Influenza A by PCR NEGATIVE NEGATIVE   Influenza B by PCR NEGATIVE NEGATIVE    Comment: (NOTE) The Xpert Xpress  SARS-CoV-2/FLU/RSV plus assay is intended as an aid in the diagnosis of influenza from Nasopharyngeal swab specimens and should not be used as a sole basis for treatment. Nasal washings and aspirates are unacceptable for Xpert Xpress SARS-CoV-2/FLU/RSV testing.  Fact Sheet for Patients: EntrepreneurPulse.com.au  Fact Sheet for Healthcare Providers: IncredibleEmployment.be  This test is not yet approved or cleared by the [place] FDA and has been authorized for detection and/or diagnosis of SARS-CoV-2 by FDA under an Emergency Use Authorization (EUA). This EUA will remain in effect (meaning this test can be used) for the duration of the COVID-19 declaration under Section 564(b)(1) of the Act, 21 U.S.C. section 360bbb-3(b)(1), unless the authorization is terminated or revoked.     Resp Syncytial Virus by PCR NEGATIVE NEGATIVE    Comment: (NOTE) Fact Sheet for Patients: EntrepreneurPulse.com.au  Fact Sheet for Healthcare Providers: IncredibleEmployment.be  This test is not yet approved or cleared by the [place] FDA and has been authorized for detection and/or diagnosis of SARS-CoV-2 by FDA under an Emergency Use Authorization (EUA). This EUA will remain in effect (meaning this test can be used) for the duration of the COVID-19 declaration under Section 564(b)(1) of the Act, 21 U.S.C. section 360bbb-3(b)(1), unless the authorization is terminated or revoked.  Performed at [facility], [address] [place], [place] [address]    CT ABDOMEN PELVIS W CONTRAST  Result Date: 06/28/2022 CLINICAL DATA:  Acute abdominal pain. EXAM: CT ABDOMEN AND PELVIS WITH CONTRAST TECHNIQUE: Multidetector CT imaging of the abdomen and pelvis was performed using the standard protocol following bolus administration of intravenous contrast. RADIATION DOSE REDUCTION: This exam was performed according  to the departmental dose-optimization program which includes automated exposure control, adjustment of the mA and/or kV according to patient size and/or use of iterative reconstruction technique. CONTRAST:  30m OMNIPAQUE IOHEXOL 300 MG/ML  SOLN COMPARISON:  None Available. FINDINGS: Lower chest: No acute abnormality. Hepatobiliary: No focal liver abnormality is seen. No gallstones, gallbladder wall thickening, or biliary dilatation. Pancreas: Unremarkable. No pancreatic ductal dilatation or surrounding inflammatory changes. Spleen: Normal in size without focal abnormality. Adrenals/Urinary Tract: Adrenal glands are unremarkable. Kidneys are normal, without renal calculi, focal lesion, or hydronephrosis. Bladder is unremarkable. Stomach/Bowel: There is short segment of colo colonic intussusception in the transverse colon. Definitive focal lesion is not seen, but  there is some soft tissue fullness at the lead point. There is no evidence for bowel obstruction or inflammation. The appendix, small bowel and stomach are within normal limits. A few scattered sigmoid colon diverticula are present. Vascular/Lymphatic: No significant vascular findings are present. No enlarged abdominal or pelvic lymph nodes. Reproductive: Prostate is unremarkable. There surgical clips in the inguinal canals. Other: No abdominal wall hernia or abnormality. No abdominopelvic ascites. Musculoskeletal: No acute or significant osseous findings. IMPRESSION: 1. Short segment of colo colonic intussusception in the transverse colon. Definitive focal lesion is not seen, but there is some soft tissue fullness at the lead point. Recommend further evaluation with colonoscopy. No bowel obstruction. 2. No other acute localizing process in the abdomen or pelvis. Electronically Signed   By: Ronney Asters M.D.   On: 06/28/2022 22:47      Assessment/Plan Colo-colonic intussusception  Diarrhea  Patient seen and examined and relevant labs and imaging  reviewed. Patient has had diarrhea for several weeks with CT 3/11 showing intussusception of transverse colon. No definitive mass on CT. Agree with infectious work up per medicine team with GI panel pending and agree with GI consult. Its possible this is a transient finding and could consider repeat KUB/CT vs further endoscopic eval/decompression by GI. He had been tolerating regular diet PTA and can have liquids from surgical standpoint but would await GI reccs. No acute surgical intervention indicated at this time. We will continue to follow.    FEN: NPO ID: none VTE: SQH  I reviewed ED provider notes, last 24 h vitals and pain scores, last 48 h intake and output, last 24 h labs and trends, and last 24 h imaging results.   Winferd Humphrey, [facility] 06/29/2022, 11:38 AM Please see Amion for pager number during day hours 7:00am-4:30pm

## 2022-06-30 ENCOUNTER — Inpatient Hospital Stay (HOSPITAL_COMMUNITY): Payer: BC Managed Care – PPO | Admitting: Anesthesiology

## 2022-06-30 ENCOUNTER — Encounter (HOSPITAL_COMMUNITY): Payer: Self-pay | Admitting: Internal Medicine

## 2022-06-30 ENCOUNTER — Encounter (HOSPITAL_COMMUNITY)
Admission: EM | Disposition: A | Discharge status: Home / Self Care | Payer: Self-pay | Source: Home / Self Care | Attending: Internal Medicine

## 2022-06-30 DIAGNOSIS — D122 Benign neoplasm of ascending colon: Secondary | ICD-10-CM | POA: Diagnosis present

## 2022-06-30 DIAGNOSIS — D124 Benign neoplasm of descending colon: Secondary | ICD-10-CM | POA: Diagnosis present

## 2022-06-30 DIAGNOSIS — D123 Benign neoplasm of transverse colon: Secondary | ICD-10-CM | POA: Diagnosis present

## 2022-06-30 DIAGNOSIS — K56609 Unspecified intestinal obstruction, unspecified as to partial versus complete obstruction: Secondary | ICD-10-CM | POA: Diagnosis present

## 2022-06-30 DIAGNOSIS — D120 Benign neoplasm of cecum: Secondary | ICD-10-CM | POA: Diagnosis present

## 2022-06-30 DIAGNOSIS — E785 Hyperlipidemia, unspecified: Secondary | ICD-10-CM | POA: Diagnosis present

## 2022-06-30 DIAGNOSIS — K561 Intussusception: Secondary | ICD-10-CM | POA: Diagnosis not present

## 2022-06-30 DIAGNOSIS — A0811 Acute gastroenteropathy due to Norwalk agent: Secondary | ICD-10-CM | POA: Diagnosis present

## 2022-06-30 DIAGNOSIS — K635 Polyp of colon: Secondary | ICD-10-CM | POA: Diagnosis present

## 2022-06-30 DIAGNOSIS — Z1152 Encounter for screening for COVID-19: Secondary | ICD-10-CM | POA: Diagnosis not present

## 2022-06-30 HISTORY — PX: POLYPECTOMY: SHX5525

## 2022-06-30 HISTORY — PX: SUBMUCOSAL TATTOO INJECTION: SHX6856

## 2022-06-30 HISTORY — PX: COLONOSCOPY WITH PROPOFOL: SHX5780

## 2022-06-30 HISTORY — PX: HEMOSTASIS CLIP PLACEMENT: SHX6857

## 2022-06-30 HISTORY — PX: SCLEROTHERAPY: SHX6841

## 2022-06-30 LAB — GASTROINTESTINAL PANEL BY PCR, STOOL (REPLACES STOOL CULTURE)

## 2022-06-30 LAB — COMPREHENSIVE METABOLIC PANEL
ALT: 21 U/L (ref 0–44)
AST: 24 U/L (ref 15–41)
Albumin: 3.7 g/dL (ref 3.5–5.0)
Alkaline Phosphatase: 45 U/L (ref 38–126)
Anion gap: 9 (ref 5–15)
BUN: 13 mg/dL (ref 6–20)
CO2: 23 mmol/L (ref 22–32)
Calcium: 8.8 mg/dL — ABNORMAL LOW (ref 8.9–10.3)
Chloride: 107 mmol/L (ref 98–111)
Creatinine, Ser: 1.22 mg/dL (ref 0.61–1.24)
GFR, Estimated: >60 mL/min (ref >60)
Glucose, Bld: 99 mg/dL (ref 70–99)
Potassium: 3.8 mmol/L (ref 3.5–5.1)
Sodium: 139 mmol/L (ref 135–145)
Total Bilirubin: 1 mg/dL (ref 0.3–1.2)
Total Protein: 6.1 g/dL — ABNORMAL LOW (ref 6.5–8.1)

## 2022-06-30 LAB — CBC
HCT: 43.2 % (ref 39.0–52.0)
Hemoglobin: 14.3 g/dL (ref 13.0–17.0)
MCH: 29.4 pg (ref 26.0–34.0)
MCHC: 33.1 g/dL (ref 30.0–36.0)
MCV: 88.9 fL (ref 80.0–100.0)
Platelets: 179 10*3/uL (ref 150–400)
RBC: 4.86 MIL/uL (ref 4.22–5.81)
RDW: 12.9 % (ref 11.5–15.5)
WBC: 5.8 10*3/uL (ref 4.0–10.5)
nRBC: 0 % (ref 0.0–0.2)

## 2022-06-30 LAB — HEMOGLOBIN AND HEMATOCRIT, BLOOD
HCT: 41.7 % (ref 39.0–52.0)
Hemoglobin: 13.8 g/dL (ref 13.0–17.0)

## 2022-06-30 SURGERY — COLONOSCOPY WITH PROPOFOL
Anesthesia: Monitor Anesthesia Care

## 2022-06-30 MED ORDER — SODIUM CHLORIDE 0.9 % IV SOLN: INTRAVENOUS | Status: DC

## 2022-06-30 MED ORDER — PROPOFOL 500 MG/50ML IV EMUL
INTRAVENOUS | Status: DC | PRN
  Administered 2022-06-30: 150 ug/kg/min via INTRAVENOUS

## 2022-06-30 MED ORDER — EPINEPHRINE 1 MG/10ML IJ SOSY
PREFILLED_SYRINGE | INTRAMUSCULAR | Status: DC | PRN
  Administered 2022-06-30: 1.3 mg via SUBCUTANEOUS
  Administered 2022-06-30: .5 mg via SUBCUTANEOUS

## 2022-06-30 MED ORDER — PROPOFOL 10 MG/ML IV BOLUS
INTRAVENOUS | Status: DC | PRN
  Administered 2022-06-30: 50 mg via INTRAVENOUS

## 2022-06-30 MED ORDER — PROPOFOL 1000 MG/100ML IV EMUL
INTRAVENOUS | Status: AC
  Filled 2022-06-30: qty 100

## 2022-06-30 MED ORDER — PROPOFOL 500 MG/50ML IV EMUL
INTRAVENOUS | Status: AC
  Filled 2022-06-30: qty 50

## 2022-06-30 MED ORDER — SPOT INK MARKER SYRINGE KIT
PACK | SUBMUCOSAL | Status: DC | PRN
  Administered 2022-06-30: 5 mL via SUBMUCOSAL

## 2022-06-30 MED ORDER — EPINEPHRINE PF 1 MG/ML IJ SOLN
INTRAMUSCULAR | Status: AC
  Filled 2022-06-30: qty 1

## 2022-06-30 MED ORDER — MORPHINE SULFATE (PF) 2 MG/ML IV SOLN
2.0000 mg | INTRAVENOUS | Status: DC | PRN
  Administered 2022-06-30 – 2022-07-01 (×4): 2 mg via INTRAVENOUS
  Filled 2022-06-30 (×4): qty 1

## 2022-06-30 MED ORDER — LACTATED RINGERS IV SOLN
INTRAVENOUS | Status: AC | PRN
  Administered 2022-06-30: 1000 mL via INTRAVENOUS

## 2022-06-30 MED ORDER — LACTATED RINGERS IV SOLN: INTRAVENOUS | Status: DC

## 2022-06-30 MED ORDER — EPINEPHRINE 1 MG/10ML IJ SOSY
PREFILLED_SYRINGE | INTRAMUSCULAR | Status: AC
  Filled 2022-06-30: qty 30

## 2022-06-30 MED ORDER — SPOT INK MARKER SYRINGE KIT
PACK | SUBMUCOSAL | Status: AC
  Filled 2022-06-30: qty 5

## 2022-06-30 SURGICAL SUPPLY — 22 items

## 2022-06-30 NOTE — Op Note (Signed)
[facility] Patient Name: Nathan Terrell Procedure Date: 06/30/2022 MRN: [id number] Attending MD: Carol Ada , MD, [id number] Date of Birth: 1980/12/29 CSN: [id number] Age: 42 Admit Type: Inpatient Procedure:                Colonoscopy Indications:              Abnormal CT of the GI tract Providers:                Carol Ada, MD, Cathlean Marseilles Brien Mates, Technician Referring MD:              Medicines:                Propofol per Anesthesia Complications:            No immediate complications. Estimated Blood Loss:     Estimated blood loss: none. Procedure:                Pre-Anesthesia Assessment:                           - Prior to the procedure, a History and Physical                            was performed, and patient medications and                            allergies were reviewed. The patient's tolerance of                            previous anesthesia was also reviewed. The risks                            and benefits of the procedure and the sedation                            options and risks were discussed with the patient.                            All questions were answered, and informed consent                            was obtained. Prior Anticoagulants: The patient has                            taken no anticoagulant or antiplatelet agents. ASA                            Grade Assessment: II - A patient with mild systemic                            disease. After reviewing the risks and benefits,  the patient was deemed in satisfactory condition to                            undergo the procedure.                           - Sedation was administered by an anesthesia                            professional. Deep sedation was attained.                           After obtaining informed consent, the colonoscope                            was passed under direct vision.  Throughout the                            procedure, the patient's blood pressure, pulse, and                            oxygen saturations were monitored continuously. The                            [id number]) Olympus colonoscope was                            introduced through the anus and advanced to the the                            cecum, identified by appendiceal orifice and                            ileocecal valve. The colonoscopy was extremely                            difficult. The patient tolerated the procedure                            well. The quality of the bowel preparation was                            evaluated using the BBPS [facility] Bowel Preparation                            Scale) with scores of: Right Colon = 2 (minor                            amount of residual staining, small fragments of                            stool and/or opaque liquid, but mucosa seen well),  Transverse Colon = 2 (minor amount of residual                            staining, small fragments of stool and/or opaque                            liquid, but mucosa seen well) and Left Colon = 3                            (entire mucosa seen well with no residual staining,                            small fragments of stool or opaque liquid). The                            total BBPS score equals 7. The quality of the bowel                            preparation was good. The ileocecal valve,                            appendiceal orifice, and rectum were photographed. Scope In: 12:00:00 PM Scope Out: 1:41:21 PM Scope Withdrawal Time: 1 hour 29 minutes 8 seconds  Total Procedure Duration: 1 hour 41 minutes 21 seconds  Findings:      Four sessile polyps were found in the descending colon, ascending colon       and cecum. The polyps were 3 to 4 mm in size. These polyps were removed       with a cold snare. Resection and retrieval were complete.      A  greater than 50 mm polyp was found in the transverse colon. The polyp       was multi-lobulated and semi-pedunculated. Polypectomy was attempted,       initially using a hot snare. Polyp resection was incomplete with this       device. This intervention then required a different device and       polypectomy technique. The polyp was removed with a piecemeal technique       using a hot snare. Polyp resection was incomplete, and the resected       tissue was partially retrieved. Area was successfully injected with 18       mL of a 0.1 mg/mL solution of epinephrine for hemostasis. Area was       tattooed with an injection of 5 mL of Spot (carbon black). To prevent       bleeding post-intervention, six hemostatic clips were successfully       placed (MR safe). Clip manufacturer: [employer]. There was no       bleeding at the end of the procedure.      The source of the intussusception was identified. It was an enormous       polyp in the distal transverse colon that appeared deceptively smaller       during the initial inspection. It appeared to be amenable for       colonoscopic resection as the polyp was mobile and a stalk was present.       To aid  in the resection 1:10,000 Epi was injected in the stalk as well       as into the polyp itself. The goal was to reduce the size of the polyp       to facilitate the polypectomy. The colonoscope was advanced to the cecum       and inspection was performed as well as some cold snare polypectomies.       The colonoscope was gradually withdrawn to the large polyp, but there       was no significant change with its size after the epi injection. A       piecemeal resection was planned and portions of the polyp were ensnared       to debulk the polyp. During the debulking process it was clear that the       polyp was much larger than intially thought and the base was much wider.       Because the polypectomy commenced, the decision was to continue to        debulk the lesion. Leaving a partial resection would leave the patient       at a higher risk of bleeding. Multiple injections with epi were       performed and a total of 18 ml of 1:10,000 Epi was injected to lift and       control bleeding. The head of the polyp was ultimately resected and a       large mucosal base defect was created. There was clear evidence of       residual polyp. Attempts were made to ensure the residual polyp, but       these efforts were unsuccessful. There was a concern that this residual       polyp represented invasive disease and aggressive snaring would result       in a perforation. Multiple times during the procedure the [employer] Net was       used to retrieve the resected portions of the polyp, however, many polyp       fragments were not collected as it as too voluminous to capture. The       mucosal defect was partially closed. It became evident that continued       closure of the defect would result in a severe stenosis of the colon. A       lumen was left to allow for passage of liquid and smaller to       moderately-sized stool. A tattoo site just proximal and on the opposite       wall to the site of resection was selected and injected with SPOT.       Because of the large mucosal defect, and the extensive resection, there       is a high risk for bleeding and/or perforation. Impression:               - Four 3 to 4 mm polyps in the descending colon, in                            the ascending colon and in the cecum, removed with                            a cold snare. Resected and retrieved.                           -  One greater than 50 mm polyp in the transverse                            colon, removed piecemeal using a hot snare. Polyp                            resection was incomplete, and the resected tissue                            was partially retrieved. Injected. Tattooed. Clip                            manufacturer: [employer]. Clips (MR safe)                            were placed. Moderate Sedation:      Not Applicable - Patient had care per Anesthesia. Recommendation:           - Return patient to hospital ward for ongoing care.                           - NPO.                           - Continue present medications.                           - Await pathology results.                           - Consider surgical resection of the site.                           - Follow closely for any signs/symptoms of                            perforation and post polyectomy bleeding. The                            patient is high risk for these complications. Procedure Code(s):        --- Professional ---                           [phone], Colonoscopy, flexible; with removal of                            tumor(s), polyp(s), or other lesion(s) by snare                            technique                           [id number], Colonoscopy, flexible; with directed                            submucosal injection(s), any substance Diagnosis Code(s):        ---  Professional ---                           D12.4, Benign neoplasm of descending colon                           D12.2, Benign neoplasm of ascending colon                           D12.0, Benign neoplasm of cecum                           D12.3, Benign neoplasm of transverse colon (hepatic                            flexure or splenic flexure)                           R93.3, Abnormal findings on diagnostic imaging of                            other parts of digestive tract CPT copyright 2022 American Medical Association. All rights reserved. The codes documented in this report are preliminary and upon coder review may  be revised to meet current compliance requirements. Carol Ada, MD Carol Ada, MD 06/30/2022 6:35:18 PM This report has been signed electronically. Number of Addenda: 0

## 2022-06-30 NOTE — Progress Notes (Signed)
I discussed the procedure with the patient and the findings.  He was counseled about the pros and cons of repeat colonoscopy with repeat surveillance and endoscopic resection versus surgical resection.  Given the technical difficulty with the procedure and low likelihood of complete resection endoscopically, there is a high rate of recurrence.  Currently there is no proof that it is malignant, and grossly, it appears benign.  However, at the base there was some subjective concern of more invasive disease.  Right now he is in favor of surgical resection, but it is best for him to discuss the pros and cons with Surgery to obtain another perspective.

## 2022-06-30 NOTE — Interval H&P Note (Signed)
History and Physical Interval Note:  06/30/2022 11:52 AM  Nathan Terrell  has presented today for surgery, with the diagnosis of Intussusception.  The various methods of treatment have been discussed with the patient and family. After consideration of risks, benefits and other options for treatment, the patient has consented to  Procedure(s): COLONOSCOPY WITH PROPOFOL (N/A) as a surgical intervention.  The patient's history has been reviewed, patient examined, no change in status, stable for surgery.  I have reviewed the patient's chart and labs.  Questions were answered to the patient's satisfaction.     Colbi Schiltz D

## 2022-06-30 NOTE — Progress Notes (Signed)
Progress Note     Subjective: No new complaints this am. Still having loose bowel movements. Tolerated prep. Cramping improved   Objective: Vital signs in last 24 hours: Temp:  [97.3 F (36.3 C)-98.6 F (37 C)] 98.1 F (36.7 C) (03/13 0537) Pulse Rate:  [47-75] 47 (03/13 0537) Resp:  [16-18] 18 (03/13 0537) BP: (124-162)/(75-92) 135/76 (03/13 0537) SpO2:  [96 %-99 %] 96 % (03/13 0537) Last BM Date : 06/29/22  Intake/Output from previous day: 03/12 0701 - 03/13 0700 In: 1393.7 [I.V.:1393.7] Out: 700 [Urine:700] Intake/Output this shift: No intake/output data recorded.  PE: General: pleasant, WD, male who is laying in bed in NAD Lungs:  Respiratory effort nonlabored Abd: soft, NT, ND MSK: all 4 extremities are symmetrical with no cyanosis, clubbing, or edema. Skin: warm and dry Psych: A&Ox3 with an appropriate affect.    Lab Results:  Recent Labs    06/29/22 1150 06/30/22 0453  WBC 6.5 5.8  HGB 15.0 14.3  HCT 46.1 43.2  PLT 203 179   BMET Recent Labs    06/29/22 1150 06/30/22 0453  NA 139 139  K 3.9 3.8  CL 104 107  CO2 25 23  GLUCOSE 100* 99  BUN 18 13  CREATININE 1.21 1.22  CALCIUM 8.8* 8.8*   PT/INR No results for input(s): "LABPROT", "INR" in the last 72 hours. CMP     Component Value Date/Time   NA 139 06/30/2022 0453   K 3.8 06/30/2022 0453   CL 107 06/30/2022 0453   CO2 23 06/30/2022 0453   GLUCOSE 99 06/30/2022 0453   BUN 13 06/30/2022 0453   CREATININE 1.22 06/30/2022 0453   CALCIUM 8.8 (L) 06/30/2022 0453   PROT 6.1 (L) 06/30/2022 0453   ALBUMIN 3.7 06/30/2022 0453   AST 24 06/30/2022 0453   ALT 21 06/30/2022 0453   ALKPHOS 45 06/30/2022 0453   BILITOT 1.0 06/30/2022 0453   GFRNONAA >60 06/30/2022 0453   Lipase     Component Value Date/Time   LIPASE 33 06/28/2022 2006       Studies/Results: CT ABDOMEN PELVIS W CONTRAST  Result Date: 06/28/2022 CLINICAL DATA:  Acute abdominal pain. EXAM: CT ABDOMEN AND PELVIS WITH  CONTRAST TECHNIQUE: Multidetector CT imaging of the abdomen and pelvis was performed using the standard protocol following bolus administration of intravenous contrast. RADIATION DOSE REDUCTION: This exam was performed according to the departmental dose-optimization program which includes automated exposure control, adjustment of the mA and/or kV according to patient size and/or use of iterative reconstruction technique. CONTRAST:  36m OMNIPAQUE IOHEXOL 300 MG/ML  SOLN COMPARISON:  None Available. FINDINGS: Lower chest: No acute abnormality. Hepatobiliary: No focal liver abnormality is seen. No gallstones, gallbladder wall thickening, or biliary dilatation. Pancreas: Unremarkable. No pancreatic ductal dilatation or surrounding inflammatory changes. Spleen: Normal in size without focal abnormality. Adrenals/Urinary Tract: Adrenal glands are unremarkable. Kidneys are normal, without renal calculi, focal lesion, or hydronephrosis. Bladder is unremarkable. Stomach/Bowel: There is short segment of colo colonic intussusception in the transverse colon. Definitive focal lesion is not seen, but there is some soft tissue fullness at the lead point. There is no evidence for bowel obstruction or inflammation. The appendix, small bowel and stomach are within normal limits. A few scattered sigmoid colon diverticula are present. Vascular/Lymphatic: No significant vascular findings are present. No enlarged abdominal or pelvic lymph nodes. Reproductive: Prostate is unremarkable. There surgical clips in the inguinal canals. Other: No abdominal wall hernia or abnormality. No abdominopelvic ascites. Musculoskeletal: No acute  or significant osseous findings. IMPRESSION: 1. Short segment of colo colonic intussusception in the transverse colon. Definitive focal lesion is not seen, but there is some soft tissue fullness at the lead point. Recommend further evaluation with colonoscopy. No bowel obstruction. 2. No other acute localizing  process in the abdomen or pelvis. Electronically Signed   By: Ronney Asters M.D.   On: 06/28/2022 22:47    Anti-infectives: Anti-infectives (From admission, onward)    None        Assessment/Plan Colo-colonic intussusception  Diarrhea   - colonoscopy today per GI. Will follow results for possible need for colonic resection by colorectal surgery - GI panel pending  FEN: NPO for c scope ID: none currently VTE: SQH  I reviewed Consultant GI notes, hospitalist notes, last 24 h vitals and pain scores, last 48 h intake and output, last 24 h labs and trends, and last 24 h imaging results.    LOS: 0 days   Winferd Humphrey, [facility] 06/30/2022, 7:32 AM Please see Amion for pager number during day hours 7:00am-4:30pm

## 2022-06-30 NOTE — Anesthesia Procedure Notes (Signed)
Procedure Name: MAC Date/Time: 06/30/2022 11:56 AM  Performed by: Niel Hummer, CRNAPre-anesthesia Checklist: Patient identified, Emergency Drugs available, Suction available and Patient being monitored Oxygen Delivery Method: Simple face mask

## 2022-06-30 NOTE — Transfer of Care (Signed)
Immediate Anesthesia Transfer of Care Note  Patient: Nathan Terrell  Procedure(s) Performed: COLONOSCOPY WITH PROPOFOL POLYPECTOMY SCLEROTHERAPY  Patient Location: PACU  Anesthesia Type:MAC  Level of Consciousness: awake, alert , and oriented  Airway & Oxygen Therapy: Patient Spontanous Breathing and Patient connected to face mask oxygen  Post-op Assessment: Report given to RN and Post -op Vital signs reviewed and stable  Post vital signs: Reviewed and stable  Last Vitals:  Vitals Value Taken Time  BP    Temp    Pulse    Resp    SpO2      Last Pain:  Vitals:   06/30/22 1117  TempSrc: Tympanic  PainSc: 0-No pain         Complications: No notable events documented.

## 2022-06-30 NOTE — Anesthesia Preprocedure Evaluation (Signed)
Anesthesia Evaluation  Patient identified by MRN, date of birth, ID band Patient awake    Reviewed: Allergy & Precautions, H&P , NPO status , Patient's Chart, lab work & pertinent test results  Airway Mallampati: I  TM Distance: >3 FB Neck ROM: Full    Dental no notable dental hx. (+) Teeth Intact, Dental Advisory Given   Pulmonary neg pulmonary ROS   Pulmonary exam normal breath sounds clear to auscultation       Cardiovascular Exercise Tolerance: Good negative cardio ROS Normal cardiovascular exam Rhythm:Regular Rate:Normal     Neuro/Psych negative neurological ROS  negative psych ROS   GI/Hepatic negative GI ROS, Neg liver ROS,,,  Endo/Other  negative endocrine ROS    Renal/GU negative Renal ROS  negative genitourinary   Musculoskeletal negative musculoskeletal ROS (+) Arthritis ,    Abdominal   Peds negative pediatric ROS (+)  Hematology negative hematology ROS (+)   Anesthesia Other Findings   Reproductive/Obstetrics negative OB ROS                             Anesthesia Physical Anesthesia Plan  ASA: 2  Anesthesia Plan: MAC   Post-op Pain Management: Minimal or no pain anticipated   Induction: Intravenous  PONV Risk Score and Plan: 2  Airway Management Planned: Mask, Natural Airway and Simple Face Mask  Additional Equipment: None  Intra-op Plan:   Post-operative Plan:   Informed Consent: I have reviewed the patients History and Physical, chart, labs and discussed the procedure including the risks, benefits and alternatives for the proposed anesthesia with the patient or authorized representative who has indicated his/her understanding and acceptance.       Plan Discussed with: Anesthesiologist and CRNA  Anesthesia Plan Comments:        Anesthesia Quick Evaluation

## 2022-06-30 NOTE — Anesthesia Postprocedure Evaluation (Signed)
Anesthesia Post Note  Patient: Nathan Terrell  Procedure(s) Performed: COLONOSCOPY WITH PROPOFOL POLYPECTOMY SCLEROTHERAPY HEMOSTASIS CLIP PLACEMENT SUBMUCOSAL TATTOO INJECTION     Patient location during evaluation: PACU Anesthesia Type: MAC Level of consciousness: awake and alert Pain management: pain level controlled Vital Signs Assessment: post-procedure vital signs reviewed and stable Respiratory status: spontaneous breathing, nonlabored ventilation, respiratory function stable and patient connected to nasal cannula oxygen Cardiovascular status: stable and blood pressure returned to baseline Postop Assessment: no apparent nausea or vomiting Anesthetic complications: no   No notable events documented.  Last Vitals:  Vitals:   06/30/22 1406 06/30/22 1428  BP: (!) 149/106 (!) 154/96  Pulse: (!) 55 (!) 47  Resp: 14 18  Temp:  36.5 C  SpO2: 98% 99%    Last Pain:  Vitals:   06/30/22 1428  TempSrc: Oral  PainSc:                  [name]

## 2022-07-01 DIAGNOSIS — K635 Polyp of colon: Secondary | ICD-10-CM | POA: Insufficient documentation

## 2022-07-01 DIAGNOSIS — K561 Intussusception: Secondary | ICD-10-CM | POA: Diagnosis not present

## 2022-07-01 DIAGNOSIS — A0811 Acute gastroenteropathy due to Norwalk agent: Secondary | ICD-10-CM | POA: Insufficient documentation

## 2022-07-01 LAB — BASIC METABOLIC PANEL
Anion gap: 9 (ref 5–15)
BUN: 12 mg/dL (ref 6–20)
CO2: 25 mmol/L (ref 22–32)
Calcium: 8.5 mg/dL — ABNORMAL LOW (ref 8.9–10.3)
Chloride: 104 mmol/L (ref 98–111)
Creatinine, Ser: 1.2 mg/dL (ref 0.61–1.24)
GFR, Estimated: >60 mL/min (ref >60)
Glucose, Bld: 98 mg/dL (ref 70–99)
Potassium: 3.9 mmol/L (ref 3.5–5.1)
Sodium: 138 mmol/L (ref 135–145)

## 2022-07-01 LAB — CBC
HCT: 41.6 % (ref 39.0–52.0)
Hemoglobin: 13.8 g/dL (ref 13.0–17.0)
MCH: 29.3 pg (ref 26.0–34.0)
MCHC: 33.2 g/dL (ref 30.0–36.0)
MCV: 88.3 fL (ref 80.0–100.0)
Platelets: 170 10*3/uL (ref 150–400)
RBC: 4.71 MIL/uL (ref 4.22–5.81)
RDW: 12.4 % (ref 11.5–15.5)
WBC: 9.7 10*3/uL (ref 4.0–10.5)
nRBC: 0 % (ref 0.0–0.2)

## 2022-07-01 MED ORDER — ACETAMINOPHEN 325 MG PO TABS
650.0000 mg | ORAL_TABLET | Freq: Four times a day (QID) | ORAL | Status: DC | PRN
  Administered 2022-07-01 (×2): 650 mg via ORAL
  Filled 2022-07-01 (×2): qty 2

## 2022-07-01 MED ORDER — ACETAMINOPHEN 325 MG PO TABS
ORAL_TABLET | ORAL | Status: AC
  Administered 2022-07-01: 650 mg via ORAL
  Filled 2022-07-01: qty 2

## 2022-07-01 NOTE — Progress Notes (Addendum)
   Progress Note  1 Day Post-Op  Subjective: Still with some crampy abdominal pain this a.m. but better from yesterday.  Has some bloody stool yesterday evening but none so far today.  Denies nausea/vomiting  Objective: Vital signs in last 24 hours: Temp:  [96.8 F (36 C)-100.6 F (38.1 C)] 99 F (37.2 C) (03/14 0712) Pulse Rate:  [47-71] 71 (03/14 0712) Resp:  [12-50] 16 (03/14 0712) BP: (118-170)/(68-106) 133/83 (03/14 0712) SpO2:  [96 %-100 %] 96 % (03/14 0712) Weight:  [83 kg] 83 kg (03/13 1117) Last BM Date : 06/30/22  Intake/Output from previous day: 03/13 0701 - 03/14 0700 In: 2270 [I.V.:2270] Out: -  Intake/Output this shift: No intake/output data recorded.  PE: General: pleasant, WD, male who is laying in bed in NAD Lungs:  Respiratory effort nonlabored Abd: soft, NT, ND MSK: all 4 extremities are symmetrical with no cyanosis, clubbing, or edema. Skin: warm and dry Psych: A&Ox3 with an appropriate affect.    Lab Results:  Recent Labs    06/30/22 0453 06/30/22 2338 07/01/22 0809  WBC 5.8  --  9.7  HGB 14.3 13.8 13.8  HCT 43.2 41.7 41.6  PLT 179  --  170    BMET Recent Labs    06/30/22 0453 07/01/22 0809  NA 139 138  K 3.8 3.9  CL 107 104  CO2 23 25  GLUCOSE 99 98  BUN 13 12  CREATININE 1.22 1.20  CALCIUM 8.8* 8.5*    PT/INR No results for input(s): "LABPROT", "INR" in the last 72 hours. CMP     Component Value Date/Time   NA 138 07/01/2022 0809   K 3.9 07/01/2022 0809   CL 104 07/01/2022 0809   CO2 25 07/01/2022 0809   GLUCOSE 98 07/01/2022 0809   BUN 12 07/01/2022 0809   CREATININE 1.20 07/01/2022 0809   CALCIUM 8.5 (L) 07/01/2022 0809   PROT 6.1 (L) 06/30/2022 0453   ALBUMIN 3.7 06/30/2022 0453   AST 24 06/30/2022 0453   ALT 21 06/30/2022 0453   ALKPHOS 45 06/30/2022 0453   BILITOT 1.0 06/30/2022 0453   GFRNONAA >60 07/01/2022 0809   Lipase     Component Value Date/Time   LIPASE 33 06/28/2022 2006        Studies/Results: No results found.  Anti-infectives: Anti-infectives (From admission, onward)    None        Assessment/Plan Colo-colonic intussusception  Diarrhea -norovirus - s/p colonoscopy with resolution of intussusception by Dr. Benson Norway on 3/13 with findings of large polyp >50 mm that was not completely resectable.  - follow biopsy results -GI panel positive for norovirus -Tmax 100.6 F overnight but no leukocytosis.  Hemoglobin stable - discussed with colorectal surgeons. Recommend awaiting pathology and planning for surgical resection outpatient if he continues to clinically improve here. Given his young age and size of polyp(s) recommend genetic testing referral   Start CLD  FEN: clears ID: none currently VTE: SQH  I reviewed Consultant GI notes, hospitalist notes, last 24 h vitals and pain scores, last 48 h intake and output, last 24 h labs and trends, and last 24 h imaging results.    LOS: 1 day   [facility] 07/01/2022, 9:35 AM Please see Amion for pager number during day hours 7:00am-4:30pm

## 2022-07-01 NOTE — Progress Notes (Signed)
.   Transition of Care [facility]) Screening Note   Patient Details  Name: Nathan Terrell Date of Birth: [birth year]   [facility]) CM/SW Contact:    Illene Regulus, LCSW Phone Number: 07/01/2022, 12:21 PM    Transition of Care Department [facility]) has reviewed patient and no TOC needs have been identified at this time. We will continue to monitor patient advancement through interdisciplinary progression rounds. If new patient transition needs arise, please place a TOC consult.

## 2022-07-01 NOTE — Progress Notes (Signed)
[facility] Progress Note  Patient: Nathan Terrell     [id number]  DOA: 06/28/2022   PCP: Maury Dus, MD (Inactive)       Brief hospital course: This is a 42 year old male with no significant past medical history who has been having abdominal pain, cramping and diarrhea for about 2 weeks.  No nausea vomiting, fever sore throat runny nose or cough. In the ED he was noted to have colocolonic intussusception in the transverse colon without any focal lesion  Subjective:  He had a fever last night. Last bloodly BM was 7 pm yesterday. He passed a strawberry sized piece of (? Polyp) with BM.  He feels hot, like he is going to have a fever again.   Assessment and Plan: Principal Problem:   Intussusception of colon ([place]) -The patient underwent a colonoscopy and was found to have a large polyp which was mostly removed - general surgery recommends to f/u biopsy result prior to dicussing surgery - Genetic testing recommended - I have made the referral   Active Problems:  Fever - 100.6 a 1 AM on 3/14 - follow- he was not having fevers prior to the colonoscopy - will start antibiotics if it recurs  GI bleed - Secondary to polypectomy - Continue to follow hemoglobin levels- he has had a 2 gm drop in Hgb which is likely partly dilutional  Norovirus - Stool has tested positive for norovirus-discussed with patient     Code Status: Full Code Consultants: GI Level of Care: Level of care: Progressive Total time on patient care: 35 minutes DVT prophylaxis: Heparin  Objective:   Vitals:   07/01/22 0159 07/01/22 0712 07/01/22 1004 07/01/22 1225  BP: 118/70 133/83  136/73  Pulse: 67 71  66  Resp: '18 16  19  '$ Temp: (!) 100.6 F (38.1 C) 99 F (37.2 C) 99.1 F (37.3 C) 98.9 F (37.2 C)  TempSrc: Oral Oral Oral Oral  SpO2: 96% 96%  95%  Weight:      Height:       Filed Weights   06/28/22 2002 06/30/22 1117  Weight: 83 kg 83 kg   Exam: General exam: Appears  comfortable  HEENT: oral mucosa moist Respiratory system: Clear to auscultation.  Cardiovascular system: S1 & S2 heard  Gastrointestinal system: Abdomen soft, non-tender, nondistended. Normal bowel sounds   Extremities: No cyanosis, clubbing or edema Psychiatry:  Mood & affect appropriate.    CBC: Recent Labs  Lab 06/28/22 2006 06/29/22 1150 06/30/22 0453 06/30/22 2338 07/01/22 0809  WBC 12.0* 6.5 5.8  --  9.7  NEUTROABS  --  3.4  --   --   --   HGB 15.8 15.0 14.3 13.8 13.8  HCT 46.8 46.1 43.2 41.7 41.6  MCV 88.0 91.1 88.9  --  88.3  PLT 231 203 179  --  [id number]    Basic Metabolic Panel: Recent Labs  Lab 06/28/22 2006 06/29/22 1150 06/30/22 0453 07/01/22 0809  NA 141 139 139 138  K 3.9 3.9 3.8 3.9  CL 102 104 107 104  CO2 '28 25 23 25  '$ GLUCOSE 100* 100* 99 98  BUN 21* '18 13 12  '$ CREATININE 1.34* 1.21 1.22 1.20  CALCIUM 9.8 8.8* 8.8* 8.5*  MG  --  2.1  --   --   PHOS  --  3.0  --   --     GFR: Estimated Creatinine Clearance: 82 mL/min (by C-G formula based on SCr of 1.2 mg/dL).  Scheduled Meds:  heparin  5,000 Units Subcutaneous Q8H   Continuous Infusions: Imaging and lab data was personally reviewed No results found.  LOS: 1 day   Author: Debbe Odea  07/01/2022 2:50 PM  To contact [facility]>   Check the care team in [facility] and look for the attending/consulting [facility] provider listed  Log into [email or web address] and use [address]'s universal password   Go to> "[facility]"  and find provider  If you still have difficulty reaching the provider, please page the [facility] (Director on Call) for the Hospitalists listed on amion

## 2022-07-01 NOTE — Progress Notes (Signed)
[facility] Progress Note  Patient: Nathan Terrell     [id number]  DOA: 06/28/2022   PCP: Maury Dus, MD (Inactive)       Brief hospital course: This is a 42 year old male with no significant past medical history who has been having abdominal pain, cramping and diarrhea for about 2 weeks.  No nausea vomiting, fever sore throat runny nose or cough. In the ED he was noted to have colocolonic intussusception in the transverse colon without any focal lesion  Subjective:  Has been drinking colon prep and continues to have diarrhea-nonbloody  Assessment and Plan: Principal Problem:   Intussusception of colon ([place]) -The patient underwent a colonoscopy and was found to have a large polyp which was mostly removed  Began to have bright red blood in stool after colonoscopy with increased crampy pain that required morphine - GI have requested a general surgery consult to decide if the patient needs surgical removal of the remainder of the polyp Active Problems: GI bleed - Secondary to polypectomy - Continue to follow hemoglobin levels  Norovirus - Stool has tested positive for norovirus-discussed with patient     Code Status: Full Code Consultants: GI Level of Care: Level of care: Progressive Total time on patient care: 35 minutes DVT prophylaxis: Heparin  Objective:   Vitals:   07/01/22 0159 07/01/22 0712 07/01/22 1004 07/01/22 1225  BP: 118/70 133/83  136/73  Pulse: 67 71  66  Resp: '18 16  19  '$ Temp: (!) 100.6 F (38.1 C) 99 F (37.2 C) 99.1 F (37.3 C) 98.9 F (37.2 C)  TempSrc: Oral Oral Oral Oral  SpO2: 96% 96%  95%  Weight:      Height:       Filed Weights   06/28/22 2002 06/30/22 1117  Weight: 83 kg 83 kg   Exam: General exam: Appears comfortable  HEENT: oral mucosa moist Respiratory system: Clear to auscultation.  Cardiovascular system: S1 & S2 heard  Gastrointestinal system: Abdomen soft, non-tender, nondistended. Normal bowel sounds    Extremities: No cyanosis, clubbing or edema Psychiatry:  Mood & affect appropriate.      CBC: Recent Labs  Lab 06/28/22 2006 06/29/22 1150 06/30/22 0453 06/30/22 2338 07/01/22 0809  WBC 12.0* 6.5 5.8  --  9.7  NEUTROABS  --  3.4  --   --   --   HGB 15.8 15.0 14.3 13.8 13.8  HCT 46.8 46.1 43.2 41.7 41.6  MCV 88.0 91.1 88.9  --  88.3  PLT 231 203 179  --  [id number]   Basic Metabolic Panel: Recent Labs  Lab 06/28/22 2006 06/29/22 1150 06/30/22 0453 07/01/22 0809  NA 141 139 139 138  K 3.9 3.9 3.8 3.9  CL 102 104 107 104  CO2 '28 25 23 25  '$ GLUCOSE 100* 100* 99 98  BUN 21* '18 13 12  '$ CREATININE 1.34* 1.21 1.22 1.20  CALCIUM 9.8 8.8* 8.8* 8.5*  MG  --  2.1  --   --   PHOS  --  3.0  --   --    GFR: Estimated Creatinine Clearance: 82 mL/min (by C-G formula based on SCr of 1.2 mg/dL).  Scheduled Meds:  heparin  5,000 Units Subcutaneous Q8H   Continuous Infusions: Imaging and lab data was personally reviewed No results found.  LOS: 1 day   Author: Debbe Odea  07/01/2022 2:43 PM  To contact [facility]>   Check the care team in [facility] and look for the attending/consulting [facility] provider  listed  Log into [email or web address] and use [place]'s universal password   Go to> "[facility]"  and find provider  If you still have difficulty reaching the provider, please page the [facility] (Director on Call) for the Hospitalists listed on amion

## 2022-07-01 NOTE — Progress Notes (Signed)
Subjective: No reports of any bleeding.  Abdominal pain 3/10 intermittently overnight.  Objective: Vital signs in last 24 hours: Temp:  [96.8 F (36 C)-100.6 F (38.1 C)] 100.6 F (38.1 C) (03/14 0159) Pulse Rate:  [47-71] 67 (03/14 0159) Resp:  [12-50] 18 (03/14 0159) BP: (118-170)/(68-106) 118/70 (03/14 0159) SpO2:  [96 %-100 %] 96 % (03/14 0159) Weight:  [83 kg] 83 kg (03/13 1117) Last BM Date : 06/30/22  Intake/Output from previous day: 03/13 0701 - 03/14 0700 In: 2270 [I.V.:2270] Out: -  Intake/Output this shift: Total I/O In: 1391.7 [I.V.:1391.7] Out: -   General appearance: alert and no distress GI: soft, non-tender; bowel sounds normal; no masses,  no organomegaly  Lab Results: Recent Labs    06/28/22 2006 06/29/22 1150 06/30/22 0453 06/30/22 2338  WBC 12.0* 6.5 5.8  --   HGB 15.8 15.0 14.3 13.8  HCT 46.8 46.1 43.2 41.7  PLT 231 203 179  --    BMET Recent Labs    06/28/22 2006 06/29/22 1150 06/30/22 0453  NA 141 139 139  K 3.9 3.9 3.8  CL 102 104 107  CO2 '28 25 23  '$ GLUCOSE 100* 100* 99  BUN 21* 18 13  CREATININE 1.34* 1.21 1.22  CALCIUM 9.8 8.8* 8.8*   LFT Recent Labs    06/30/22 0453  PROT 6.1*  ALBUMIN 3.7  AST 24  ALT 21  ALKPHOS 45  BILITOT 1.0   PT/INR No results for input(s): "LABPROT", "INR" in the last 72 hours. Hepatitis Panel No results for input(s): "HEPBSAG", "HCVAB", "HEPAIGM", "HEPBIGM" in the last 72 hours. C-Diff No results for input(s): "CDIFFTOX" in the last 72 hours. Fecal Lactopherrin No results for input(s): "FECLLACTOFRN" in the last 72 hours.  Studies/Results: No results found.  Medications: Scheduled:  heparin  5,000 Units Subcutaneous Q8H   Continuous:  sodium chloride 100 mL/hr at 07/01/22 0500    Assessment/Plan: 1) Large distal transverse colonic polyp s/p incomplete resection. 2) Intussusception - resolved.   The patient is well clinically.  He did have a low grade fever at 100.6.  Nursing  reported that he needed to use morphine x 3.  In retrospect, he states that it was not truly necessary to use morphine.  No reports of any bleeding.  Plan: 1) Await surgical opinion about resection. 2) Follow temp.  Low threshold for starting antibiotics. 3) Check CBC and BMP.  LOS: 1 day   Rasa Degrazia D 07/01/2022, 6:48 AM

## 2022-07-02 ENCOUNTER — Encounter (HOSPITAL_COMMUNITY): Payer: Self-pay | Admitting: Gastroenterology

## 2022-07-02 DIAGNOSIS — K561 Intussusception: Secondary | ICD-10-CM | POA: Diagnosis not present

## 2022-07-02 LAB — CBC
HCT: 42.6 % (ref 39.0–52.0)
Hemoglobin: 14.1 g/dL (ref 13.0–17.0)
MCH: 29.7 pg (ref 26.0–34.0)
MCHC: 33.1 g/dL (ref 30.0–36.0)
MCV: 89.7 fL (ref 80.0–100.0)
Platelets: 175 10*3/uL (ref 150–400)
RBC: 4.75 MIL/uL (ref 4.22–5.81)
RDW: 12.3 % (ref 11.5–15.5)
WBC: 8.3 10*3/uL (ref 4.0–10.5)
nRBC: 0 % (ref 0.0–0.2)

## 2022-07-02 LAB — SURGICAL PATHOLOGY

## 2022-07-02 NOTE — Progress Notes (Signed)
The patient is alert, oriented x4, ambulatory without assistance.Discharge instructions were provided, questions concerns were denied. Patient encouraged to contact PCP provider should symptoms worse or new arise.

## 2022-07-02 NOTE — Discharge Summary (Signed)
Physician Discharge Summary  Nathan Terrell [id number] DOB: 09-28-1980 DOA: 06/28/2022  PCP: Maury Dus, MD (Inactive)  Admit date: 06/28/2022 Discharge date: 07/02/2022 Discharging to: home Recommendations for Outpatient Follow-up:  F/u with general surgery  Consults:  GI General surgery Procedures:  Colonoscopy and polypectomy   Discharge Diagnoses:   Principal Problem:   Intussusception of colon ([place]) Active Problems:   SBO (small bowel obstruction) ([place])   Polyp, colonic   Norovirus     Hospital Course:  This is a 42 year old male with no significant past medical history who has been having abdominal pain, cramping and diarrhea for about 2 weeks.  No nausea vomiting, fever sore throat runny nose or cough. 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- Dr Benson [place] to call patient with report - Genetic testing recommended - I have made the referral     Active Problems:   Fever - 100.6 a 1 AM on 3/14 -  did not recur   GI bleed - Secondary to polypectomy -Hgb 15.8> 13.8> 14.1 - no longer bleeding   Norovirus - Stool has tested positive for norovirus-discussed with patient  - diarrhea resolved      Discharge Instructions  Discharge Instructions     Ambulatory referral to Genetics   Complete by: As directed    Specific reason for medical genetics evaluation: colon polyp- large   Diet - low sodium heart healthy   Complete by: As directed    Increase activity slowly   Complete by: As directed    Increase activity slowly   Complete by: As directed        Allergies as of 07/02/2022   No Known Allergies      Medication List     TAKE these medications    acetaminophen 500 MG tablet Commonly known as: TYLENOL Take 1,000 mg by mouth as needed for moderate pain.   multivitamin with minerals tablet Take 1 tablet by mouth daily.   OVER THE COUNTER MEDICATION Take 1 tablet by mouth daily. Prostate supplement        Follow-up Information     Leighton Ruff, MD. Go on 0000000.   Specialties: General Surgery, Colon and Rectal Surgery Why: follow up with colorectal surgery 07/26/22 at 11:20 am., Please arrive 30 minutes early to complete check in, and bring photo ID and insurance card. Contact information: [address] [place] [address] [phone]                    The results of significant diagnostics from this hospitalization (including imaging, microbiology, ancillary and laboratory) are listed below for reference.    CT ABDOMEN PELVIS W CONTRAST  Result Date: 06/28/2022 CLINICAL DATA:  Acute abdominal pain. EXAM: CT ABDOMEN AND PELVIS WITH CONTRAST TECHNIQUE: Multidetector CT imaging of the abdomen and pelvis was performed using the standard protocol following bolus administration of intravenous contrast. RADIATION DOSE REDUCTION: This exam was performed according to the departmental dose-optimization program which includes automated exposure control, adjustment of the mA and/or kV according to patient size and/or use of iterative reconstruction  technique. CONTRAST:  75mL OMNIPAQUE IOHEXOL 300 MG/ML  SOLN COMPARISON:  None Available. FINDINGS: Lower chest: No acute abnormality. Hepatobiliary: No focal liver abnormality is seen. No gallstones, gallbladder wall thickening, or biliary dilatation. Pancreas: Unremarkable. No pancreatic ductal dilatation or surrounding inflammatory changes. Spleen: Normal in size without focal abnormality. Adrenals/Urinary Tract: Adrenal glands are unremarkable. Kidneys  are normal, without renal calculi, focal lesion, or hydronephrosis. Bladder is unremarkable. Stomach/Bowel: There is short segment of colo colonic intussusception in the transverse colon. Definitive focal lesion is not seen, but there is some soft tissue fullness at the lead point. There is no evidence for bowel obstruction or inflammation. The appendix, small bowel and stomach are within normal limits. A few scattered sigmoid colon diverticula are present. Vascular/Lymphatic: No significant vascular findings are present. No enlarged abdominal or pelvic lymph nodes. Reproductive: Prostate is unremarkable. There surgical clips in the inguinal canals. Other: No abdominal wall hernia or abnormality. No abdominopelvic ascites. Musculoskeletal: No acute or significant osseous findings. IMPRESSION: 1. Short segment of colo colonic intussusception in the transverse colon. Definitive focal lesion is not seen, but there is some soft tissue fullness at the lead point. Recommend further evaluation with colonoscopy. No bowel obstruction. 2. No other acute localizing process in the abdomen or pelvis. Electronically Signed   By: Ronney Asters M.D.   On: 06/28/2022 22:47   Labs:   Basic Metabolic Panel: Recent Labs  Lab 06/28/22 2006 06/29/22 1150 06/30/22 0453 07/01/22 0809  NA 141 139 139 138  K 3.9 3.9 3.8 3.9  CL 102 104 107 104  CO2 28 25 23 25   GLUCOSE 100* 100* 99 98  BUN 21* 18 13 12   CREATININE 1.34* 1.21 1.22 1.20  CALCIUM 9.8 8.8* 8.8* 8.5*  MG  --  2.1  --   --   PHOS  --  3.0  --   --      CBC: Recent Labs  Lab 06/28/22 2006 06/29/22 1150 06/30/22 0453 06/30/22 2338 07/01/22 0809 07/02/22 0445  WBC 12.0* 6.5 5.8  --  9.7 8.3  NEUTROABS  --  3.4  --   --   --   --   HGB 15.8 15.0 14.3 13.8 13.8 14.1  HCT 46.8 46.1 43.2 41.7 41.6 42.6  MCV 88.0 91.1 88.9  --  88.3 89.7  PLT 231 203 179  --  170 175         SIGNED:   Debbe Odea, MD  [facility] 07/02/2022, 8:57 AM

## 2022-07-02 NOTE — Progress Notes (Signed)
Subjective: No complaints.  Feels well.  He had his first normal stool yesterday.  Objective: Vital signs in last 24 hours: Temp:  [98.6 F (37 C)-99.7 F (37.6 C)] 98.6 F (37 C) (03/15 0455) Pulse Rate:  [58-66] 58 (03/15 0455) Resp:  [16-19] 18 (03/15 0455) BP: (133-136)/(73-75) 134/74 (03/15 0455) SpO2:  [94 %-97 %] 94 % (03/15 0455) Weight:  [87.2 kg] 87.2 kg (03/15 0455) Last BM Date : 07/01/22  Intake/Output from previous day: 03/14 0701 - 03/15 0700 In: 1366.7 [P.O.:600; I.V.:766.7] Out: 125 [Urine:125] Intake/Output this shift: No intake/output data recorded.  General appearance: alert and no distress GI: nontender  Lab Results: Recent Labs    06/30/22 0453 06/30/22 2338 07/01/22 0809 07/02/22 0445  WBC 5.8  --  9.7 8.3  HGB 14.3 13.8 13.8 14.1  HCT 43.2 41.7 41.6 42.6  PLT 179  --  170 175   BMET Recent Labs    06/29/22 1150 06/30/22 0453 07/01/22 0809  NA 139 139 138  K 3.9 3.8 3.9  CL 104 107 104  CO2 25 23 25   GLUCOSE 100* 99 98  BUN 18 13 12   CREATININE 1.21 1.22 1.20  CALCIUM 8.8* 8.8* 8.5*   LFT Recent Labs    06/30/22 0453  PROT 6.1*  ALBUMIN 3.7  AST 24  ALT 21  ALKPHOS 45  BILITOT 1.0   PT/INR No results for input(s): "LABPROT", "INR" in the last 72 hours. Hepatitis Panel No results for input(s): "HEPBSAG", "HCVAB", "HEPAIGM", "HEPBIGM" in the last 72 hours. C-Diff No results for input(s): "CDIFFTOX" in the last 72 hours. Fecal Lactopherrin No results for input(s): "FECLLACTOFRN" in the last 72 hours.  Studies/Results: No results found.  Medications: Scheduled:  heparin  5,000 Units Subcutaneous Q8H   Continuous:  Assessment/Plan: 1) Large transverse colonic polyp s/p incomplete resection.   The patient is well.  He denies any abdominal pain and there was no reports of any fever.  He has a normal WBC this AM.  Plan: 1) Okay to [facility] home. 2) Follow up with Surgery as planned. 3) I will contact him with the  results of the polyp.  LOS: 2 days   Briarrose Shor D 07/02/2022, 7:49 AM

## 2022-08-09 ENCOUNTER — Ambulatory Visit: Payer: Self-pay | Admitting: General Surgery

## 2022-08-09 NOTE — H&P (Signed)
REFERRING PHYSICIAN:  Theda Belfast, MD  PROVIDER:  Elenora Gamma, MD  MRN: [id number] DOB: 25-Oct-1980 DATE OF ENCOUNTER: 08/09/2022  Subjective   Chief Complaint: New Consultation ( large polyp seen on colonoscopy)     History of Present Illness: Nathan Terrell is a 42 y.o. male who is seen today as an office consultation at the request of Dr. Elnoria Howard for evaluation of New Consultation ( large polyp seen on colonoscopy) .  42 year old male who has had trouble with crampy abdominal pain and diarrhea for several years.  This got worse and he was seen in urgent care.  They recommended follow-up in the emergency department at [facility] where a CT scan was done.  This showed intussusception of the colon.  He then underwent bowel prep and colonoscopy a few days later.  This showed a large transverse colon polyp that was resected piecemeal as well as a descending colon and ascending colon polyp.  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  The other polyps were tubular adenomas.  The area was tattooed proximally.  He is here today to discuss surgical resection.  He has had no past abdominal surgeries.   Review of Systems: A complete review of systems was obtained from the patient.  I have reviewed this information and discussed as appropriate with the patient.  See HPI as well for other ROS.    Medical History: History reviewed. No pertinent past medical history.  There is no problem list on file for this patient.   History reviewed. No pertinent surgical history.   No Known Allergies  No current outpatient medications on file prior to visit.   No current facility-administered medications on file prior to visit.    History reviewed. No pertinent family history.   Social History   Tobacco Use  Smoking Status Never  Smokeless Tobacco Never     Social History    Socioeconomic History   Marital status: Married  Tobacco Use   Smoking status: Never   Smokeless tobacco: Never  Vaping Use   Vaping status: Never Used  Substance and Sexual Activity   Alcohol use: Yes   Drug use: Never   Social Determinants of Health   Financial Resource Strain: Low Risk  (06/07/2022)   Received from [facility], [facility]   Overall Financial Resource Strain (CARDIA)    Difficulty of Paying Living Expenses: Not hard at all  Food Insecurity: No Food Insecurity (06/29/2022)   Received from [facility], [place]   Hunger Vital Sign    Worried About Running Out of Food in the Last Year: Never true    Ran Out of Food in the Last Year: Never true  Transportation Needs: No Transportation Needs (06/29/2022)   Received from [facility], [place]   PRAPARE - Transportation    Lack of Transportation (Medical): No    Lack of Transportation (Non-Medical): No  Physical Activity: Sufficiently Active (06/07/2022)   Received from [facility], [facility]   Exercise Vital Sign    Days of Exercise per Week: 6 days    Minutes of Exercise per Session: 50 min  Stress: No Stress Concern Present (06/07/2022)   Received from [facility], [facility] - Occupational Stress Questionnaire    Feeling of Stress : Only a little  Social Connections: Socially Integrated (06/07/2022)   Received from [facility], [facility]  Social Network    How would you rate your social network (family, work, friends)?: Good participation with social networks  Housing Stability: Low Risk  (06/07/2022)   Received from [facility], [facility]   Housing Stability Vital Sign    Unable to Pay for Housing in the Last Year: No    Number of Places Lived in the Last Year: 1    In the last 12 months, was there a time when you did not have a steady place to sleep or slept in a shelter (including now)?: No    Objective:    Vitals:    08/09/22 1405  BP: 132/78  Pulse: 72  Temp: 36.7 C (98.1 F)  SpO2: 98%  Weight: 88.9 kg (196 lb)  Height: 177.8 cm ( )  PainSc: 0-No pain     Exam Gen: NAD Abd: soft    Labs, Imaging and Diagnostic Testing: CT scan reviewed, CT report reviewed as well Colonoscopy report reviewed Pathology results reviewed.  Assessment and Plan:  Diagnoses and all orders for this visit:  Adenomatous polyp of transverse colon -     polyethylene glycol (MIRALAX) powder; Take 233.75 g by mouth once for 1 dose Take according to your procedure prep instructions. -     bisacodyL (DULCOLAX) 5 mg EC tablet; Take 4 tablets (20 mg total) by mouth once daily as needed for Constipation for up to 1 dose -     metroNIDAZOLE (FLAGYL) 500 MG tablet; Take 2 tablets (1,000 mg total) by mouth 3 (three) times daily for 3 doses Take according to your procedure colon prep instructions    42 year old healthy male with large colon polyp seen on colonoscopy and CT scan.  This appears to be in his distal transverse colon.  I have recommended a robotic assisted partial colectomy.  We have discussed this in detail.  All questions were answered. The surgery and anatomy were described to the patient as well as the risks of surgery and the possible complications.  These include: Bleeding, deep abdominal infections and possible wound complications such as hernia and infection, damage to adjacent structures, leak of surgical connections, which can lead to other surgeries and possibly an ostomy, possible need for other procedures, such as abscess drains in radiology, possible prolonged hospital stay, possible diarrhea from removal of part of the colon, possible constipation from narcotics, possible bowel, bladder or sexual dysfunction if having rectal surgery, prolonged fatigue/weakness or appetite loss, possible early recurrence of of disease, possible complications of their medical problems such as heart disease or arrhythmias  or lung problems, death (less than 1%). I believe the patient understands and wishes to proceed with the surgery.   Vanita Panda, MD Colon and Rectal Surgery [facility] Surgery

## 2022-08-16 ENCOUNTER — Encounter: Payer: Self-pay | Admitting: Genetic Counselor

## 2022-08-17 ENCOUNTER — Other Ambulatory Visit: Payer: Self-pay

## 2022-08-17 ENCOUNTER — Encounter: Payer: Self-pay | Admitting: Genetic Counselor

## 2022-08-17 ENCOUNTER — Inpatient Hospital Stay: Payer: BC Managed Care – PPO | Attending: Genetic Counselor | Admitting: Genetic Counselor

## 2022-08-17 ENCOUNTER — Inpatient Hospital Stay: Payer: BC Managed Care – PPO

## 2022-08-17 ENCOUNTER — Other Ambulatory Visit: Payer: Self-pay | Admitting: Genetic Counselor

## 2022-08-17 DIAGNOSIS — D369 Benign neoplasm, unspecified site: Secondary | ICD-10-CM | POA: Diagnosis not present

## 2022-08-17 DIAGNOSIS — D123 Benign neoplasm of transverse colon: Secondary | ICD-10-CM

## 2022-08-17 DIAGNOSIS — Z83719 Family history of colon polyps, unspecified: Secondary | ICD-10-CM | POA: Insufficient documentation

## 2022-08-17 NOTE — Progress Notes (Signed)
REFERRING PROVIDER: Romie Levee, MD [address] [place],  [place] [address]  PRIMARY PROVIDER:  Elias Else, MD (Inactive)  PRIMARY REASON FOR VISIT:  1. Adenomatous polyps   2. Family history of polyps in the colon      HISTORY OF PRESENT ILLNESS:   Nathan Terrell, a 42 y.o. male, was seen for a [place] cancer genetics consultation at the request of Dr. Maisie Fus due to a personal and family history of colon polyps.  Nathan Terrell presents to clinic today to discuss the possibility of a hereditary predisposition to cancer, genetic testing, and to further clarify his future cancer risks, as well as potential cancer risks for family members.   Nathan Terrell is a 42 y.o. male with no personal history of cancer.  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  CANCER HISTORY:  Oncology History   No history exists.    Past Medical History:  Diagnosis Date   Adenomatous polyps    Cough 03/12/2013   DJD (degenerative joint disease) 02/17/2013   right shoulder (acromial)   Family history of polyps in the colon    SLAP lesion, type II 02/17/2013   right    Past Surgical History:  Procedure Laterality Date   COLONOSCOPY WITH PROPOFOL N/A 06/30/2022   Procedure: COLONOSCOPY WITH PROPOFOL;  Surgeon: Jeani Hawking, MD;  Location: [facility];  Service: Gastroenterology;  Laterality: N/A;   HEMOSTASIS CLIP PLACEMENT  06/30/2022   Procedure: HEMOSTASIS CLIP PLACEMENT;  Surgeon: Jeani Hawking, MD;  Location: [facility];  Service: Gastroenterology;;   NO PAST SURGERIES     POLYPECTOMY  06/30/2022   Procedure: POLYPECTOMY;  Surgeon: Jeani Hawking, MD;  Location: [facility];  Service: Gastroenterology;;   [name]  06/30/2022   Procedure: [name];  Surgeon: Jeani Hawking, MD;  Location: [facility];  Service: Gastroenterology;;   SHOULDER ARTHROSCOPY WITH SUBACROMIAL DECOMPRESSION, ROTATOR CUFF REPAIR AND BICEP TENDON REPAIR  Right 03/20/2013   Procedure: RIGHT SHOULDER ARTHROSCOPY WITH SUBACROMIAL DECOMPRESSION, DISTAL CLAVICLE RESECTION, OPEN BICEPS TENODESIS;  Surgeon: Wyn Forster., MD;  Location: [facility];  Service: Orthopedics;  Laterality: Right;   SUBMUCOSAL TATTOO INJECTION  06/30/2022   Procedure: SUBMUCOSAL TATTOO INJECTION;  Surgeon: Jeani Hawking, MD;  Location: [facility];  Service: Gastroenterology;;    Social History   Socioeconomic History   Marital status: Married    Spouse name: Not on file   Number of children: Not on file   Years of education: Not on file   Highest education level: Not on file  Occupational History   Not on file  Tobacco Use   Smoking status: Never   Smokeless tobacco: Never  Substance and Sexual Activity   Alcohol use: Yes    Comment: 3 days/week   Drug use: Yes    Types: Marijuana   Sexual activity: Not on file  Other Topics Concern   Not on file  Social History Narrative   Not on file   Social Determinants of Health   Financial Resource Strain: Not on file  Food Insecurity: No Food Insecurity (06/29/2022)   Hunger Vital Sign    Worried About Running Out of Food in the Last Year: Never true    Ran Out of Food in the Last Year: Never true  Transportation Needs: No Transportation Needs (06/29/2022)   PRAPARE - Administrator, Civil Service (Medical): No    Lack of Transportation (Non-Medical): No  Physical Activity: Not on file  Stress: Not on file  Social Connections: Not on file     FAMILY HISTORY:  We obtained a detailed, 4-generation family history.  Significant diagnoses are listed below: Family History  Problem Relation Age of Onset   Colon polyps Father    Colon polyps Maternal Aunt    Colon polyps Maternal Grandfather    Clotting disorder Maternal Grandfather    Other Paternal Grandmother        'tumor' in heart   Dementia Paternal Grandfather    Colon polyps Paternal Grandfather    Colon cancer Other         MGF's father   Stomach cancer Paternal Great-grandmother        PGM's mother   Colon cancer Maternal Great-grandfather        MGM's father   Brain cancer Maternal Great-grandfather        MGMs father     The patient has a son and daughter who are cancer free. He has a brother and sister who are cancer free. His brother is being referred for a colonoscopy and his sister had a colonoscopy at 60.  Both parents are living.  The patient's mother has not had a colonoscopy and is 90.  She had three sisters and a brother.  One sister had two tubular adenomas.  The maternal grandmother is living and the grandfather is deceased.  The grandfather had colon polyps.  His father had colon cancer.  The grandmother's father had colon cancer and brain cancer.  The patient's father had five colon polyps.  He has two sisters and a brother who are cancer free.  His mother had a tumor in her heart.  Her sister had leukemia and her mother had stomach cancer.  The grandfather had colon polyps.  His mother had an unknown cancer.  Nathan Terrell is unaware of previous family history of genetic testing for hereditary cancer risks. Patient's maternal ancestors are of [place] and [place] descent, and paternal ancestors are of [place] and [place] descent. There is no reported Ashkenazi Jewish ancestry. There is no known consanguinity.  GENETIC COUNSELING ASSESSMENT: Nathan Terrell is a 42 y.o. male with a personal and family history of colon polyps which is somewhat suggestive of a hereditary predisposition to cancer given his early age of tubular adenomas and the size of his colon polyps. We, therefore, discussed and recommended the following at today's visit.   DISCUSSION: We discussed that, in general, most cancer is not inherited in families, but instead is sporadic or familial. Sporadic cancers occur by chance and typically happen at older ages (>50 years) as this type of cancer is caused by genetic changes acquired during an  individual's lifetime. Some families have more cancers than would be expected by chance; however, the ages or types of cancer are not consistent with a known genetic mutation or known genetic mutations have been ruled out. This type of familial cancer is thought to be due to a combination of multiple genetic, environmental, hormonal, and lifestyle factors. While this combination of factors likely increases the risk of cancer, the exact source of this risk is not currently identifiable or testable.  We discussed that many people have polyps, and most will have fewer than five.  When an individual has 10 or more polyps it is called polyposis and it can increase the risk for colon cancer, as well as the chance of a hereditary predisposition.  Most cases of polyposis are associated with  APC mutations.  There are other genes that can be associated with hereditary polyposis cancer syndromes.  These include MUTYH, POLE, POLD1 and others.  We discussed that testing is beneficial for several reasons including knowing how to follow individuals after completing their treatment, identifying whether potential treatment options such as PARP inhibitors would be beneficial, and understand if other family members could be at risk for cancer and allow them to undergo genetic testing.   We reviewed the characteristics, features and inheritance patterns of hereditary cancer syndromes. We also discussed genetic testing, including the appropriate family members to test, the process of testing, insurance coverage and turn-around-time for results. We discussed the implications of a negative, positive, carrier and/or variant of uncertain significant result. Nathan Terrell  was offered a common hereditary cancer panel (47 genes) and an expanded pan-cancer panel (77 genes). Nathan Terrell was informed of the benefits and limitations of each panel, including that expanded pan-cancer panels contain genes that do not have clear management guidelines  at this point in time.  We also discussed that as the number of genes included on a panel increases, the chances of variants of uncertain significance increases. Nathan Terrell decided to pursue genetic testing for the Multi-cancer + RNA gene panel.   The Multi-Cancer + RNA Panel offered by [facility] includes sequencing and/or deletion/duplication analysis of the following 70 genes:  AIP*, ALK, APC*, ATM*, AXIN2*, BAP1*, BARD1*, BLM*, BMPR1A*, BRCA1*, BRCA2*, BRIP1*, CDC73*, CDH1*, CDK4, CDKN1B*, CDKN2A, CHEK2*, CTNNA1*, DICER1*, EPCAM (del/dup only), EGFR, FH*, FLCN*, GREM1 (promoter dup only), HOXB13, KIT, LZTR1, MAX*, MBD4, MEN1*, MET, MITF, MLH1*, MSH2*, MSH3*, MSH6*, MUTYH*, NF1*, NF2*, NTHL1*, PALB2*, PDGFRA, PMS2*, POLD1*, POLE*, POT1*, PRKAR1A*, PTCH1*, PTEN*, RAD51C*, RAD51D*, RB1*, RET, SDHA* (sequencing only), SDHAF2*, SDHB*, SDHC*, SDHD*, SMAD4*, SMARCA4*, SMARCB1*, SMARCE1*, STK11*, SUFU*, TMEM127*, TP53*, TSC1*, TSC2*, VHL*. RNA analysis is performed for * genes.   Based on Nathan Terrell personal and family history of tubular polyps, and specifically due to his young age of tubular adenoma polyps and the very large size of the one polyp, we recommend consideration for genetic testing. We discussed he may have an out of pocket cost if insurance does not cover it. We discussed that if his out of pocket cost for testing is over $100, the laboratory will call and confirm whether he wants to proceed with testing.  If the out of pocket cost of testing is less than $100 he will be billed by the genetic testing laboratory. The patient pay price for testing is $249.  We discussed that some people do not want to undergo genetic testing due to fear of genetic discrimination.  The Genetic Information Nondiscrimination Act (GINA) was signed into federal law in 2008. GINA prohibits health insurers and most employers from discriminating against individuals based on genetic information (including the results of genetic  tests and family history information). According to GINA, health insurance companies cannot consider genetic information to be a preexisting condition, nor can they use it to make decisions regarding coverage or rates. GINA also makes it illegal for most employers to use genetic information in making decisions about hiring, firing, promotion, or terms of employment. It is important to note that GINA does not offer protections for life insurance, disability insurance, or long-term care insurance. GINA does not apply to those in the [employer], those who work for companies with less than 15 employees, and new life insurance or long-term disability insurance policies.  Health status due to a cancer diagnosis is not protected under  GINA. More information about GINA can be found by visiting [email or web address].   PLAN: After our discussion, Nathan Terrell did not wish to pursue genetic testing at today's visit. We understand this decision and remain available to coordinate genetic testing at any time in the future. We, therefore, recommend Nathan Terrell continue to follow the cancer screening guidelines given by his primary healthcare provider.  Lastly, we encouraged Nathan Terrell to remain in contact with cancer genetics annually so that we can continuously update the family history and inform him of any changes in cancer genetics and testing that may be of benefit for this family.   Nathan Terrell questions were answered to his satisfaction today. Our contact information was provided should additional questions or concerns arise. Thank you for the referral and allowing [place] to share in the care of your patient.   Annah Jasko P. Lowell Guitar, MS, [facility] Licensed, Patent attorney [email or web address] phone: [phone]  The patient was seen for a total of 60 minutes in face-to-face genetic counseling.  The patient was seen alone.  Drs. Meliton Rattan, and/or [place] were available for questions, if needed..     _______________________________________________________________________ For Office Staff:  Number of people involved in session: 1 Was an Intern/ student involved with case: no

## 2022-09-17 ENCOUNTER — Encounter (HOSPITAL_COMMUNITY): Payer: Self-pay

## 2022-10-11 NOTE — Progress Notes (Signed)
COVID Vaccine received:  []  No [x]  Yes Date of any COVID positive Test in last 90 days:  PCP - Elias Else, MD (inactive) Cardiologist - none GI-  Jeani Hawking, MD  Chest x-ray -  EKG -  no hx to warrant Stress Test -  ECHO -  Cardiac Cath -   PCR screen: []  Ordered & Completed           [x]   No Order but Needs PROFEND           []   N/A for this surgery  Surgery Plan:  []  Ambulatory                            []  Outpatient in bed                            [x]  Admit  Anesthesia:    [x]  General  []  Spinal                           []   Choice []   MAC  Bowel Prep - []  No  [x]   Yes _Miralax, Dulcolax, etc_  Pacemaker / ICD device [x]  No []  Yes   Spinal Cord Stimulator:[x]  No []  Yes       History of Sleep Apnea? [x]  No []  Yes   CPAP used?- [x]  No []  Yes    Does the patient monitor blood sugar?          []  No []  Yes  [x]  N/A  Patient has: [x]  NO Hx DM   []  Pre-DM                 []  DM1  []   DM2  Blood Thinner / Instructions:  none Aspirin Instructions:  none  ERAS Protocol Ordered: []  No  [x]  Yes PRE-SURGERY [x]  ENSURE  x3   []  G2  Patient is to be NPO after: 05:30  Comments:   Activity level: Patient is able / unable to climb a flight of stairs without difficulty; []  No CP  []  No SOB, but would have ___   Patient can / can not perform ADLs without assistance.   Anesthesia review: No pertinent medical or Surgical history other than HLD  Patient denies shortness of breath, fever, cough and chest pain at PAT appointment.  Patient verbalized understanding and agreement to the Pre-Surgical Instructions that were given to them at this PAT appointment. Patient was also educated of the need to review these PAT instructions again prior to his surgery.I reviewed the appropriate phone numbers to call if they have any and questions or concerns.

## 2022-10-11 NOTE — Patient Instructions (Addendum)
SURGICAL WAITING ROOM VISITATION Patients having surgery or a procedure may have no more than 2 support people in the waiting area - these visitors may rotate in the visitor waiting room.   Due to an increase in RSV and influenza rates and associated hospitalizations, children ages 77 and under may not visit patients in [facility] hospitals. If the patient needs to stay at the hospital during part of their recovery, the visitor guidelines for inpatient rooms apply.  PRE-OP VISITATION  Pre-op nurse will coordinate an appropriate time for 1 support person to accompany the patient in pre-op.  This support person may not rotate.  This visitor will be contacted when the time is appropriate for the visitor to come back in the pre-op area.  Please refer to the [facility] website for the visitor guidelines for Inpatients (after your surgery is over and you are in a regular room).  You are not required to quarantine at this time prior to your surgery. However, you must do this: Hand Hygiene often Do NOT share personal items Notify your provider if you are in close contact with someone who has COVID or you develop fever 100.4 or greater, new onset of sneezing, cough, sore throat, shortness of breath or body aches.  If you test positive for Covid or have been in contact with anyone that has tested positive in the last 10 days please notify you surgeon.    Your procedure is scheduled on:  Wednesday  October 27, 2022  Report to [facility] Main Entrance: [facility]ota [facility]sen entrance where the [employer] is available.   Report to admitting at:  06:15   AM  +++++Call this number if you have any questions or problems the morning of surgery [phone]  DRINK two (2) bottles of Pre-Surgery Clear Ensure or G2 drink starting at 6:00 pm the evening prior to your surgery to help prevent dehydration. Increase drinking clear fluids (see list Below)          Do not eat food after Midnight the night prior to your  surgery/procedure.  After Midnight you may have the following liquids until   05:30  AM DAY OF SURGERY  Clear Liquid Diet Water Black Coffee (sugar ok, NO MILK/CREAM OR CREAMERS)  Tea (sugar ok, NO MILK/CREAM OR CREAMERS) regular and decaf                             Plain Jell-O  with no fruit (NO RED)                                           Fruit ices (not with fruit pulp, NO RED)                                     Popsicles (NO RED)                                                                  Juice: apple, WHITE grape, WHITE cranberry Sports drinks like Gatorade or Powerade (NO  RED)                   The day of surgery:  Drink ONE (1) Pre-Surgery Clear Ensure at    05:30  AM the morning of surgery. Drink in one sitting. Do not sip.  This drink was given to you during your hospital pre-op appointment visit. Nothing else to drink after completing the Pre-Surgery Clear Ensure : No candy, chewing gum or throat lozenges.    FOLLOW BOWEL PREP AND ANY ADDITIONAL PRE OP INSTRUCTIONS YOU RECEIVED FROM YOUR SURGEON'S OFFICE!!!  Dulcolax 20 mg (total) - Take 4 (four) of the 5 mg Dulcolax tablets with water at 07:00 am the day prior to surgery.  Miralax 255 g - Mix with 64 oz Gatorade/Powerade.  Starting at 10:00 am ,Drink this gradually over the next few hours (8 oz glass every 15-30 minutes) until gone the day prior to surgery You should finish in 4 hours-6 hours.    Metronidazole (Flagyl) 1000 mg - At 2 pm, 3 pm and 10 pm after Miralax bowel prep the day prior to surgery.   Drink plenty of clear liquids all evening to avoid getting dehydrated.    Oral Hygiene is also important to reduce your risk of infection.        Remember - BRUSH YOUR TEETH THE MORNING OF SURGERY WITH YOUR REGULAR TOOTHPASTE  Do NOT smoke after Midnight the night before surgery.  Take ONLY these medicines the morning of surgery with A SIP OF WATER:  none                    You may not have any metal on  your body including  jewelry, and body piercing  Do not wear  lotions, powders, cologne, or deodorant  Men may shave face and neck.  Contacts, Hearing Aids, dentures or bridgework may not be worn into surgery. DENTURES WILL BE REMOVED PRIOR TO SURGERY PLEASE DO NOT APPLY "Poly grip" OR ADHESIVES!!!  You may bring a small overnight bag with you on the day of surgery, only pack items that are not valuable. [facility] IS NOT RESPONSIBLE   FOR VALUABLES THAT ARE LOST OR STOLEN.   Do not bring your home medications to the hospital. The Pharmacy will dispense medications listed on your medication list to you during your admission in the Hospital.  Special Instructions: Bring a copy of your healthcare power of attorney and living will documents the day of surgery, if you wish to have them scanned into your [facility] Records- EPIC  Please read over the following fact sheets you were given: IF YOU HAVE QUESTIONS ABOUT YOUR PRE-OP INSTRUCTIONS, PLEASE CALL [phone].   [facility] - Preparing for Surgery Before surgery, you can play an important role.  Because skin is not sterile, your skin needs to be as free of germs as possible.  You can reduce the number of germs on your skin by washing with CHG (chlorahexidine gluconate) soap before surgery.  CHG is an antiseptic cleaner which kills germs and bonds with the skin to continue killing germs even after washing. Please DO NOT use if you have an allergy to CHG or antibacterial soaps.  If your skin becomes reddened/irritated stop using the CHG and inform your nurse when you arrive at Short Stay. Do not shave (including legs and underarms) for at least 48 hours prior to the first CHG shower.  You may shave your face/neck.  Please follow these instructions  carefully:  1.  Shower with CHG Soap the night before surgery and the  morning of surgery.  2.  If you choose to wash your hair, wash your hair first as usual with your normal  shampoo.  3.   After you shampoo, rinse your hair and body thoroughly to remove the shampoo.                             4.  Use CHG as you would any other liquid soap.  You can apply chg directly to the skin and wash.  Gently with a scrungie or clean washcloth.  5.  Apply the CHG Soap to your body ONLY FROM THE NECK DOWN.   Do not use on face/ open                           Wound or open sores. Avoid contact with eyes, ears mouth and genitals (private parts).                       Wash face,  Genitals (private parts) with your normal soap.             6.  Wash thoroughly, paying special attention to the area where your  surgery  will be performed.  7.  Thoroughly rinse your body with warm water from the neck down.  8.  DO NOT shower/wash with your normal soap after using and rinsing off the CHG Soap.            9.  Pat yourself dry with a clean towel.            10.  Wear clean pajamas.            11.  Place clean sheets on your bed the night of your first shower and do not  sleep with pets.  ON THE DAY OF SURGERY : Do not apply any lotions/deodorants the morning of surgery.  Please wear clean clothes to the hospital/surgery center.    FAILURE TO FOLLOW THESE INSTRUCTIONS MAY RESULT IN THE CANCELLATION OF YOUR SURGERY  PATIENT SIGNATURE_________________________________  NURSE SIGNATURE__________________________________  ________________________________________________________________________       Nathan Terrell    An incentive spirometer is a tool that can help keep your lungs clear and active. This tool measures how well you are filling your lungs with each breath. Taking long deep breaths may help reverse or decrease the chance of developing breathing (pulmonary) problems (especially infection) following: A long period of time when you are unable to move or be active. BEFORE THE PROCEDURE  If the spirometer includes an indicator to show your best effort, your nurse or respiratory  therapist will set it to a desired goal. If possible, sit up straight or lean slightly forward. Try not to slouch. Hold the incentive spirometer in an upright position. INSTRUCTIONS FOR USE  Sit on the edge of your bed if possible, or sit up as far as you can in bed or on a chair. Hold the incentive spirometer in an upright position. Breathe out normally. Place the mouthpiece in your mouth and seal your lips tightly around it. Breathe in slowly and as deeply as possible, raising the piston or the ball toward the top of the column. Hold your breath for 3-5 seconds or for as long as possible. Allow the piston or ball to fall to  the bottom of the column. Remove the mouthpiece from your mouth and breathe out normally. Rest for a few seconds and repeat Steps 1 through 7 at least 10 times every 1-2 hours when you are awake. Take your time and take a few normal breaths between deep breaths. The spirometer may include an indicator to show your best effort. Use the indicator as a goal to work toward during each repetition. After each set of 10 deep breaths, practice coughing to be sure your lungs are clear. If you have an incision (the cut made at the time of surgery), support your incision when coughing by placing a pillow or rolled up towels firmly against it. Once you are able to get out of bed, walk around indoors and cough well. You may stop using the incentive spirometer when instructed by your caregiver.  RISKS AND COMPLICATIONS Take your time so you do not get dizzy or light-headed. If you are in pain, you may need to take or ask for pain medication before doing incentive spirometry. It is harder to take a deep breath if you are having pain. AFTER USE Rest and breathe slowly and easily. It can be helpful to keep track of a log of your progress. Your caregiver can provide you with a simple table to help with this. If you are using the spirometer at home, follow these instructions: SEEK MEDICAL  CARE IF:  You are having difficultly using the spirometer. You have trouble using the spirometer as often as instructed. Your pain medication is not giving enough relief while using the spirometer. You develop fever of 100.5 F (38.1 C) or higher.                                                                                                    SEEK IMMEDIATE MEDICAL CARE IF:  You cough up bloody sputum that had not been present before. You develop fever of 102 F (38.9 C) or greater. You develop worsening pain at or near the incision site. MAKE SURE YOU:  Understand these instructions. Will watch your condition. Will get help right away if you are not doing well or get worse. Document Released: 08/16/2006 Document Revised: 06/28/2011 Document Reviewed: 10/17/2006 [facility] Patient Information 2014 [place], [place].

## 2022-10-13 ENCOUNTER — Encounter (HOSPITAL_COMMUNITY)
Admission: RE | Admit: 2022-10-13 | Discharge: 2022-10-13 | Disposition: A | Discharge status: Home / Self Care | Payer: BC Managed Care – PPO | Source: Ambulatory Visit | Attending: General Surgery | Admitting: General Surgery

## 2022-10-13 ENCOUNTER — Encounter (HOSPITAL_COMMUNITY): Payer: Self-pay

## 2022-10-13 ENCOUNTER — Other Ambulatory Visit: Payer: Self-pay

## 2022-10-13 VITALS — BP 128/81 | Temp 98.7°F | Resp 12 | Ht 70.0 in | Wt 192.0 lb

## 2022-10-13 DIAGNOSIS — K561 Intussusception: Secondary | ICD-10-CM | POA: Diagnosis not present

## 2022-10-13 DIAGNOSIS — D369 Benign neoplasm, unspecified site: Secondary | ICD-10-CM | POA: Diagnosis not present

## 2022-10-13 DIAGNOSIS — Z01812 Encounter for preprocedural laboratory examination: Secondary | ICD-10-CM | POA: Insufficient documentation

## 2022-10-13 DIAGNOSIS — Z01818 Encounter for other preprocedural examination: Secondary | ICD-10-CM

## 2022-10-13 LAB — CBC
HCT: 46.7 % (ref 39.0–52.0)
Hemoglobin: 15.3 g/dL (ref 13.0–17.0)
MCH: 29.5 pg (ref 26.0–34.0)
MCHC: 32.8 g/dL (ref 30.0–36.0)
MCV: 90 fL (ref 80.0–100.0)
Platelets: 204 10*3/uL (ref 150–400)
RBC: 5.19 MIL/uL (ref 4.22–5.81)
RDW: 13.2 % (ref 11.5–15.5)
WBC: 5.4 10*3/uL (ref 4.0–10.5)
nRBC: 0 % (ref 0.0–0.2)

## 2022-10-13 LAB — BASIC METABOLIC PANEL
Anion gap: 5 (ref 5–15)
BUN: 22 mg/dL — ABNORMAL HIGH (ref 6–20)
CO2: 26 mmol/L (ref 22–32)
Calcium: 9 mg/dL (ref 8.9–10.3)
Chloride: 106 mmol/L (ref 98–111)
Creatinine, Ser: 1.2 mg/dL (ref 0.61–1.24)
GFR, Estimated: >60 mL/min (ref >60)
Glucose, Bld: 103 mg/dL — ABNORMAL HIGH (ref 70–99)
Potassium: 3.8 mmol/L (ref 3.5–5.1)
Sodium: 137 mmol/L (ref 135–145)

## 2022-10-27 ENCOUNTER — Encounter (HOSPITAL_COMMUNITY): Payer: Self-pay | Admitting: General Surgery

## 2022-10-27 ENCOUNTER — Inpatient Hospital Stay (HOSPITAL_COMMUNITY): Payer: BC Managed Care – PPO | Admitting: Anesthesiology

## 2022-10-27 ENCOUNTER — Other Ambulatory Visit: Payer: Self-pay

## 2022-10-27 ENCOUNTER — Inpatient Hospital Stay (HOSPITAL_COMMUNITY)
Admission: RE | Admit: 2022-10-27 | Discharge: 2022-10-30 | DRG: 330 | Disposition: A | Discharge status: Home / Self Care | Payer: BC Managed Care – PPO | Attending: Surgery | Admitting: Surgery

## 2022-10-27 ENCOUNTER — Encounter (HOSPITAL_COMMUNITY)
Admission: RE | Disposition: A | Discharge status: Home / Self Care | Payer: Self-pay | Source: Home / Self Care | Attending: General Surgery

## 2022-10-27 DIAGNOSIS — K635 Polyp of colon: Secondary | ICD-10-CM | POA: Diagnosis present

## 2022-10-27 DIAGNOSIS — D369 Benign neoplasm, unspecified site: Secondary | ICD-10-CM

## 2022-10-27 DIAGNOSIS — K561 Intussusception: Secondary | ICD-10-CM | POA: Diagnosis present

## 2022-10-27 DIAGNOSIS — C184 Malignant neoplasm of transverse colon: Secondary | ICD-10-CM | POA: Diagnosis present

## 2022-10-27 DIAGNOSIS — Z01818 Encounter for other preprocedural examination: Principal | ICD-10-CM

## 2022-10-27 DIAGNOSIS — M25511 Pain in right shoulder: Secondary | ICD-10-CM | POA: Diagnosis not present

## 2022-10-27 DIAGNOSIS — Z83719 Family history of colon polyps, unspecified: Secondary | ICD-10-CM | POA: Diagnosis not present

## 2022-10-27 LAB — TYPE AND SCREEN
ABO/RH(D): A POS
Antibody Screen: NEGATIVE

## 2022-10-27 LAB — ABO/RH: ABO/RH(D): A POS

## 2022-10-27 SURGERY — COLECTOMY, PARTIAL, ROBOT-ASSISTED, LAPAROSCOPIC
Anesthesia: General

## 2022-10-27 MED ORDER — SODIUM CHLORIDE 0.9 % IV SOLN
2.0000 g | INTRAVENOUS | Status: AC
  Administered 2022-10-27: 2 g via INTRAVENOUS
  Filled 2022-10-27: qty 2

## 2022-10-27 MED ORDER — BUPIVACAINE LIPOSOME 1.3 % IJ SUSP
INTRAMUSCULAR | Status: DC | PRN
  Administered 2022-10-27: 20 mL

## 2022-10-27 MED ORDER — HYDROMORPHONE HCL 1 MG/ML IJ SOLN
0.5000 mg | INTRAMUSCULAR | Status: DC | PRN
  Administered 2022-10-27 – 2022-10-29 (×6): 0.5 mg via INTRAVENOUS
  Filled 2022-10-27 (×6): qty 0.5

## 2022-10-27 MED ORDER — LIDOCAINE 2% (20 MG/ML) 5 ML SYRINGE
INTRAMUSCULAR | Status: DC | PRN
  Administered 2022-10-27: 1.5 mg/kg/h via INTRAVENOUS

## 2022-10-27 MED ORDER — ROCURONIUM BROMIDE 100 MG/10ML IV SOLN
INTRAVENOUS | Status: DC | PRN
  Administered 2022-10-27 (×2): 30 mg via INTRAVENOUS
  Administered 2022-10-27: 50 mg via INTRAVENOUS

## 2022-10-27 MED ORDER — ENSURE SURGERY PO LIQD
237.0000 mL | Freq: Two times a day (BID) | ORAL | Status: DC
  Administered 2022-10-28 – 2022-10-30 (×3): 237 mL via ORAL

## 2022-10-27 MED ORDER — RINGERS IRRIGATION IR SOLN
Status: DC | PRN
  Administered 2022-10-27: 1

## 2022-10-27 MED ORDER — KETAMINE HCL 10 MG/ML IJ SOLN
INTRAMUSCULAR | Status: DC | PRN
  Administered 2022-10-27: 50 mg via INTRAVENOUS

## 2022-10-27 MED ORDER — PROPOFOL 10 MG/ML IV BOLUS
INTRAVENOUS | Status: DC | PRN
  Administered 2022-10-27: 180 mg via INTRAVENOUS

## 2022-10-27 MED ORDER — BUPIVACAINE HCL (PF) 0.25 % IJ SOLN
INTRAMUSCULAR | Status: DC | PRN
  Administered 2022-10-27: 30 mL

## 2022-10-27 MED ORDER — GABAPENTIN 300 MG PO CAPS
300.0000 mg | ORAL_CAPSULE | ORAL | Status: AC
  Administered 2022-10-27: 300 mg via ORAL
  Filled 2022-10-27: qty 1

## 2022-10-27 MED ORDER — OXYCODONE HCL 5 MG PO TABS
5.0000 mg | ORAL_TABLET | ORAL | Status: DC | PRN
  Administered 2022-10-27 – 2022-10-29 (×6): 5 mg via ORAL
  Filled 2022-10-27 (×6): qty 1

## 2022-10-27 MED ORDER — DIPHENHYDRAMINE HCL 50 MG/ML IJ SOLN: 12.5000 mg | Freq: Four times a day (QID) | INTRAMUSCULAR | Status: DC | PRN

## 2022-10-27 MED ORDER — ONDANSETRON HCL 4 MG/2ML IJ SOLN
INTRAMUSCULAR | Status: DC | PRN
  Administered 2022-10-27: 4 mg via INTRAVENOUS

## 2022-10-27 MED ORDER — OXYCODONE HCL 5 MG PO TABS: 5.0000 mg | ORAL_TABLET | Freq: Once | ORAL | Status: DC | PRN

## 2022-10-27 MED ORDER — OXYCODONE HCL 5 MG/5ML PO SOLN: 5.0000 mg | Freq: Once | ORAL | Status: DC | PRN

## 2022-10-27 MED ORDER — ENSURE PRE-SURGERY PO LIQD
296.0000 mL | Freq: Once | ORAL | Status: DC
  Filled 2022-10-27: qty 296

## 2022-10-27 MED ORDER — ACETAMINOPHEN 500 MG PO TABS
1000.0000 mg | ORAL_TABLET | Freq: Four times a day (QID) | ORAL | Status: DC
  Administered 2022-10-27 – 2022-10-30 (×11): 1000 mg via ORAL
  Filled 2022-10-27 (×11): qty 2

## 2022-10-27 MED ORDER — INDOCYANINE GREEN 25 MG IV SOLR
INTRAVENOUS | Status: DC | PRN
  Administered 2022-10-27: 2.5 mg via INTRAVENOUS

## 2022-10-27 MED ORDER — FENTANYL CITRATE PF 50 MCG/ML IJ SOSY
25.0000 ug | PREFILLED_SYRINGE | INTRAMUSCULAR | Status: DC | PRN
  Administered 2022-10-27: 50 ug via INTRAVENOUS

## 2022-10-27 MED ORDER — GABAPENTIN 100 MG PO CAPS
300.0000 mg | ORAL_CAPSULE | Freq: Two times a day (BID) | ORAL | Status: DC
  Administered 2022-10-27 – 2022-10-30 (×6): 300 mg via ORAL
  Filled 2022-10-27 (×6): qty 3

## 2022-10-27 MED ORDER — ENSURE PRE-SURGERY PO LIQD
592.0000 mL | Freq: Once | ORAL | Status: DC
  Filled 2022-10-27: qty 592

## 2022-10-27 MED ORDER — SUGAMMADEX SODIUM 200 MG/2ML IV SOLN
INTRAVENOUS | Status: DC | PRN
  Administered 2022-10-27: 200 mg via INTRAVENOUS

## 2022-10-27 MED ORDER — ENOXAPARIN SODIUM 40 MG/0.4ML IJ SOSY
40.0000 mg | PREFILLED_SYRINGE | INTRAMUSCULAR | Status: DC
  Administered 2022-10-28 – 2022-10-30 (×3): 40 mg via SUBCUTANEOUS
  Filled 2022-10-27 (×3): qty 0.4

## 2022-10-27 MED ORDER — EPHEDRINE 5 MG/ML INJ
INTRAVENOUS | Status: AC
  Filled 2022-10-27: qty 5

## 2022-10-27 MED ORDER — FENTANYL CITRATE PF 50 MCG/ML IJ SOSY
PREFILLED_SYRINGE | INTRAMUSCULAR | Status: AC
  Administered 2022-10-27: 50 ug via INTRAVENOUS
  Filled 2022-10-27: qty 1

## 2022-10-27 MED ORDER — PROMETHAZINE HCL 25 MG/ML IJ SOLN: 6.2500 mg | INTRAMUSCULAR | Status: DC | PRN

## 2022-10-27 MED ORDER — MIDAZOLAM HCL 5 MG/5ML IJ SOLN
INTRAMUSCULAR | Status: DC | PRN
  Administered 2022-10-27: 2 mg via INTRAVENOUS

## 2022-10-27 MED ORDER — SIMETHICONE 80 MG PO CHEW: 40.0000 mg | CHEWABLE_TABLET | Freq: Four times a day (QID) | ORAL | Status: DC | PRN

## 2022-10-27 MED ORDER — ROCURONIUM BROMIDE 10 MG/ML (PF) SYRINGE
PREFILLED_SYRINGE | INTRAVENOUS | Status: AC
  Filled 2022-10-27: qty 10

## 2022-10-27 MED ORDER — BUPIVACAINE LIPOSOME 1.3 % IJ SUSP: 20.0000 mL | Freq: Once | INTRAMUSCULAR | Status: DC

## 2022-10-27 MED ORDER — ALVIMOPAN 12 MG PO CAPS: 12.0000 mg | ORAL_CAPSULE | Freq: Two times a day (BID) | ORAL | Status: DC

## 2022-10-27 MED ORDER — SACCHAROMYCES BOULARDII 250 MG PO CAPS
250.0000 mg | ORAL_CAPSULE | Freq: Two times a day (BID) | ORAL | Status: DC
  Administered 2022-10-27 – 2022-10-30 (×7): 250 mg via ORAL
  Filled 2022-10-27 (×7): qty 1

## 2022-10-27 MED ORDER — ALUM & MAG HYDROXIDE-SIMETH 200-200-20 MG/5ML PO SUSP: 30.0000 mL | Freq: Four times a day (QID) | ORAL | Status: DC | PRN

## 2022-10-27 MED ORDER — ONDANSETRON HCL 4 MG/2ML IJ SOLN
INTRAMUSCULAR | Status: AC
  Filled 2022-10-27: qty 2

## 2022-10-27 MED ORDER — DEXAMETHASONE SODIUM PHOSPHATE 10 MG/ML IJ SOLN
INTRAMUSCULAR | Status: DC | PRN
  Administered 2022-10-27: 10 mg via INTRAVENOUS

## 2022-10-27 MED ORDER — KCL IN DEXTROSE-NACL 20-5-0.45 MEQ/L-%-% IV SOLN
INTRAVENOUS | Status: DC
  Filled 2022-10-27 (×2): qty 1000

## 2022-10-27 MED ORDER — METHYLENE BLUE 1 % INJ SOLN
INTRAVENOUS | Status: AC
  Filled 2022-10-27: qty 10

## 2022-10-27 MED ORDER — PROPOFOL 10 MG/ML IV BOLUS
INTRAVENOUS | Status: AC
  Filled 2022-10-27: qty 20

## 2022-10-27 MED ORDER — ORAL CARE MOUTH RINSE: 15.0000 mL | Freq: Once | OROMUCOSAL | Status: AC

## 2022-10-27 MED ORDER — BUPIVACAINE LIPOSOME 1.3 % IJ SUSP
INTRAMUSCULAR | Status: AC
  Filled 2022-10-27: qty 20

## 2022-10-27 MED ORDER — BISACODYL 5 MG PO TBEC
20.0000 mg | DELAYED_RELEASE_TABLET | Freq: Once | ORAL | Status: DC
Start: 2022-10-27 — End: 2022-10-27

## 2022-10-27 MED ORDER — KETOROLAC TROMETHAMINE 30 MG/ML IJ SOLN: 30.0000 mg | Freq: Once | INTRAMUSCULAR | Status: AC | PRN

## 2022-10-27 MED ORDER — DIPHENHYDRAMINE HCL 12.5 MG/5ML PO ELIX: 12.5000 mg | ORAL_SOLUTION | Freq: Four times a day (QID) | ORAL | Status: DC | PRN

## 2022-10-27 MED ORDER — LIDOCAINE HCL (PF) 2 % IJ SOLN
INTRAMUSCULAR | Status: DC | PRN
  Administered 2022-10-27: 60 mg via INTRADERMAL

## 2022-10-27 MED ORDER — SODIUM CHLORIDE 0.9 % IV SOLN
2.0000 g | Freq: Two times a day (BID) | INTRAVENOUS | Status: AC
  Administered 2022-10-27: 2 g via INTRAVENOUS
  Filled 2022-10-27: qty 2

## 2022-10-27 MED ORDER — LACTATED RINGERS IV SOLN: INTRAVENOUS | Status: DC

## 2022-10-27 MED ORDER — AMISULPRIDE (ANTIEMETIC) 5 MG/2ML IV SOLN: 10.0000 mg | Freq: Once | INTRAVENOUS | Status: DC | PRN

## 2022-10-27 MED ORDER — BUPIVACAINE HCL 0.25 % IJ SOLN
INTRAMUSCULAR | Status: AC
  Filled 2022-10-27: qty 1

## 2022-10-27 MED ORDER — EPHEDRINE SULFATE-NACL 50-0.9 MG/10ML-% IV SOSY
PREFILLED_SYRINGE | INTRAVENOUS | Status: DC | PRN
  Administered 2022-10-27: 5 mg via INTRAVENOUS

## 2022-10-27 MED ORDER — KETAMINE HCL 50 MG/5ML IJ SOSY
PREFILLED_SYRINGE | INTRAMUSCULAR | Status: AC
  Filled 2022-10-27: qty 5

## 2022-10-27 MED ORDER — MIDAZOLAM HCL 2 MG/2ML IJ SOLN
INTRAMUSCULAR | Status: AC
  Filled 2022-10-27: qty 2

## 2022-10-27 MED ORDER — ACETAMINOPHEN 500 MG PO TABS
1000.0000 mg | ORAL_TABLET | ORAL | Status: AC
  Administered 2022-10-27: 1000 mg via ORAL
  Filled 2022-10-27: qty 2

## 2022-10-27 MED ORDER — ALVIMOPAN 12 MG PO CAPS
12.0000 mg | ORAL_CAPSULE | ORAL | Status: AC
  Administered 2022-10-27: 12 mg via ORAL
  Filled 2022-10-27: qty 1

## 2022-10-27 MED ORDER — FENTANYL CITRATE (PF) 250 MCG/5ML IJ SOLN
INTRAMUSCULAR | Status: AC
  Filled 2022-10-27: qty 5

## 2022-10-27 MED ORDER — KETOROLAC TROMETHAMINE 30 MG/ML IJ SOLN
INTRAMUSCULAR | Status: AC
  Administered 2022-10-27: 30 mg via INTRAVENOUS
  Filled 2022-10-27: qty 1

## 2022-10-27 MED ORDER — HEPARIN SODIUM (PORCINE) 5000 UNIT/ML IJ SOLN
5000.0000 [IU] | Freq: Once | INTRAMUSCULAR | Status: AC
  Administered 2022-10-27: 5000 [IU] via SUBCUTANEOUS
  Filled 2022-10-27: qty 1

## 2022-10-27 MED ORDER — LACTATED RINGERS IV SOLN: INTRAVENOUS | Status: DC | PRN

## 2022-10-27 MED ORDER — LIDOCAINE HCL (PF) 2 % IJ SOLN
INTRAMUSCULAR | Status: AC
  Filled 2022-10-27: qty 10

## 2022-10-27 MED ORDER — DEXAMETHASONE SODIUM PHOSPHATE 10 MG/ML IJ SOLN
INTRAMUSCULAR | Status: AC
  Filled 2022-10-27: qty 1

## 2022-10-27 MED ORDER — POLYETHYLENE GLYCOL 3350 17 GM/SCOOP PO POWD
1.0000 | Freq: Once | ORAL | Status: DC
Start: 2022-10-27 — End: 2022-10-27

## 2022-10-27 MED ORDER — CHLORHEXIDINE GLUCONATE 0.12 % MT SOLN
15.0000 mL | Freq: Once | OROMUCOSAL | Status: AC
  Administered 2022-10-27: 15 mL via OROMUCOSAL

## 2022-10-27 MED ORDER — FENTANYL CITRATE (PF) 100 MCG/2ML IJ SOLN
INTRAMUSCULAR | Status: DC | PRN
  Administered 2022-10-27: 50 ug via INTRAVENOUS

## 2022-10-27 MED ORDER — ONDANSETRON HCL 4 MG PO TABS: 4.0000 mg | ORAL_TABLET | Freq: Four times a day (QID) | ORAL | Status: DC | PRN

## 2022-10-27 MED ORDER — ONDANSETRON HCL 4 MG/2ML IJ SOLN: 4.0000 mg | Freq: Four times a day (QID) | INTRAMUSCULAR | Status: DC | PRN

## 2022-10-27 SURGICAL SUPPLY — 88 items
BAG COUNTER SPONGE SURGICOUNT (BAG) ×1 IMPLANT
BAG SPNG CNTER NS LX DISP (BAG) ×1
BLADE EXTENDED COATED 6.5IN (ELECTRODE) IMPLANT
CANNULA REDUCER 12-8 DVNC XI (CANNULA) IMPLANT
CELLS DAT CNTRL 66122 CELL SVR (MISCELLANEOUS) IMPLANT
COVER SURGICAL LIGHT HANDLE (MISCELLANEOUS) ×2 IMPLANT
COVER TIP SHEARS 8 DVNC (MISCELLANEOUS) ×1 IMPLANT
DRAIN CHANNEL 19F RND (DRAIN) IMPLANT
DRAPE ARM DVNC X/XI (DISPOSABLE) ×4 IMPLANT
DRAPE COLUMN DVNC XI (DISPOSABLE) ×1 IMPLANT
DRAPE SURG IRRIG POUCH 19X23 (DRAPES) ×1 IMPLANT
DRIVER NDL LRG 8 DVNC XI (INSTRUMENTS) ×1 IMPLANT
DRIVER NDLE LRG 8 DVNC XI (INSTRUMENTS) ×1 IMPLANT
DRSG OPSITE POSTOP 4X10 (GAUZE/BANDAGES/DRESSINGS) IMPLANT
DRSG OPSITE POSTOP 4X6 (GAUZE/BANDAGES/DRESSINGS) IMPLANT
DRSG OPSITE POSTOP 4X8 (GAUZE/BANDAGES/DRESSINGS) IMPLANT
ELECT PENCIL ROCKER SW 15FT (MISCELLANEOUS) ×1 IMPLANT
ELECT REM PT RETURN 15FT ADLT (MISCELLANEOUS) ×1 IMPLANT
ENDOLOOP SUT PDS II 0 18 (SUTURE) IMPLANT
EVACUATOR SILICONE 100CC (DRAIN) IMPLANT
GLOVE BIO SURGEON STRL SZ 6.5 (GLOVE) ×3 IMPLANT
GLOVE INDICATOR 6.5 STRL GRN (GLOVE) ×3 IMPLANT
GOWN SRG XL LVL 4 BRTHBL STRL (GOWNS) ×1 IMPLANT
GOWN STRL NON-REIN XL LVL4 (GOWNS) ×1
GOWN STRL REUS W/ TWL XL LVL3 (GOWN DISPOSABLE) ×3 IMPLANT
GOWN STRL REUS W/TWL XL LVL3 (GOWN DISPOSABLE) ×3
GRASPER SUT TROCAR 14GX15 (MISCELLANEOUS) IMPLANT
GRASPER TIP-UP FEN DVNC XI (INSTRUMENTS) ×1 IMPLANT
HOLDER FOLEY CATH W/STRAP (MISCELLANEOUS) ×1 IMPLANT
IRRIG SUCT STRYKERFLOW 2 WTIP (MISCELLANEOUS) ×1
IRRIGATION SUCT STRKRFLW 2 WTP (MISCELLANEOUS) ×1 IMPLANT
KIT PROCEDURE DVNC SI (MISCELLANEOUS) IMPLANT
KIT TURNOVER KIT A (KITS) IMPLANT
NDL INSUFFLATION 14GA 120MM (NEEDLE) ×1 IMPLANT
NEEDLE INSUFFLATION 14GA 120MM (NEEDLE) ×1 IMPLANT
PACK CARDIOVASCULAR III (CUSTOM PROCEDURE TRAY) ×1 IMPLANT
PACK COLON (CUSTOM PROCEDURE TRAY) ×1 IMPLANT
PAD POSITIONING PINK XL (MISCELLANEOUS) ×1 IMPLANT
RELOAD STAPLE 60 2.5 WHT DVNC (STAPLE) IMPLANT
RELOAD STAPLE 60 3.5 BLU DVNC (STAPLE) IMPLANT
RELOAD STAPLE 60 4.3 GRN DVNC (STAPLE) IMPLANT
RELOAD STAPLER 2.5X60 WHT DVNC (STAPLE) ×1 IMPLANT
RELOAD STAPLER 3.5X60 BLU DVNC (STAPLE) ×2 IMPLANT
RELOAD STAPLER 4.3X60 GRN DVNC (STAPLE) ×1 IMPLANT
RETRACTOR WND ALEXIS 18 MED (MISCELLANEOUS) IMPLANT
RTRCTR WOUND ALEXIS 18CM MED (MISCELLANEOUS)
SCISSORS LAP 5X35 DISP (ENDOMECHANICALS) IMPLANT
SCISSORS MNPLR CVD DVNC XI (INSTRUMENTS) ×1 IMPLANT
SEAL UNIV 5-12 XI (MISCELLANEOUS) ×3 IMPLANT
SEALER VESSEL EXT DVNC XI (MISCELLANEOUS) ×1 IMPLANT
SOL ELECTROSURG ANTI STICK (MISCELLANEOUS) ×1
SOLUTION ELECTROSURG ANTI STCK (MISCELLANEOUS) ×1 IMPLANT
SPIKE FLUID TRANSFER (MISCELLANEOUS) IMPLANT
STAPLER 60 SUREFORM DVNC (STAPLE) IMPLANT
STAPLER ECHELON POWER CIR 29 (STAPLE) IMPLANT
STAPLER ECHELON POWER CIR 31 (STAPLE) IMPLANT
STAPLER RELOAD 2.5X60 WHT DVNC (STAPLE) ×1
STAPLER RELOAD 3.5X60 BLU DVNC (STAPLE) ×2
STAPLER RELOAD 4.3X60 GRN DVNC (STAPLE) ×1
STOPCOCK 4 WAY LG BORE MALE ST (IV SETS) ×2 IMPLANT
SUT ETHILON 2 0 PS N (SUTURE) IMPLANT
SUT NOVA NAB GS-21 1 T12 (SUTURE) ×2 IMPLANT
SUT PROLENE 2 0 KS (SUTURE) IMPLANT
SUT SILK 2 0 (SUTURE) ×1
SUT SILK 2 0 SH CR/8 (SUTURE) IMPLANT
SUT SILK 2-0 18XBRD TIE 12 (SUTURE) ×1 IMPLANT
SUT SILK 3 0 (SUTURE)
SUT SILK 3 0 SH CR/8 (SUTURE) ×1 IMPLANT
SUT SILK 3-0 18XBRD TIE 12 (SUTURE) IMPLANT
SUT V-LOC BARB 180 2/0GR6 GS22 (SUTURE) ×2
SUT VIC AB 2-0 SH 18 (SUTURE) IMPLANT
SUT VIC AB 2-0 SH 27 (SUTURE)
SUT VIC AB 2-0 SH 27X BRD (SUTURE) IMPLANT
SUT VIC AB 3-0 SH 18 (SUTURE) IMPLANT
SUT VIC AB 4-0 PS2 27 (SUTURE) ×2 IMPLANT
SUT VICRYL 0 UR6 27IN ABS (SUTURE) ×1 IMPLANT
SUTURE V-LC BRB 180 2/0GR6GS22 (SUTURE) IMPLANT
SYR 20ML ECCENTRIC (SYRINGE) ×1 IMPLANT
SYS LAPSCP GELPORT 120MM (MISCELLANEOUS)
SYS WOUND ALEXIS 18CM MED (MISCELLANEOUS)
SYSTEM LAPSCP GELPORT 120MM (MISCELLANEOUS) IMPLANT
SYSTEM WOUND ALEXIS 18CM MED (MISCELLANEOUS) IMPLANT
TOWEL OR 17X26 10 PK STRL BLUE (TOWEL DISPOSABLE) IMPLANT
TOWEL OR NON WOVEN STRL DISP B (DISPOSABLE) ×1 IMPLANT
TRAY FOLEY MTR SLVR 16FR STAT (SET/KITS/TRAYS/PACK) ×1 IMPLANT
TROCAR ADV FIXATION 5X100MM (TROCAR) ×1 IMPLANT
TUBING CONNECTING 10 (TUBING) ×2 IMPLANT
TUBING INSUFFLATION 10FT LAP (TUBING) ×1 IMPLANT

## 2022-10-27 NOTE — Op Note (Signed)
10/27/2022  11:03 AM  PATIENT:  Nathan Terrell  42 y.o. male  Patient Care Team: Medicine, [facility] as PCP - General (Family Medicine)  PRE-OPERATIVE DIAGNOSIS:  ADENOMATOUS POLYP TRANSVERSE COLON  POST-OPERATIVE DIAGNOSIS:  ADENOMATOUS POLYP TRANSVERSE COLON  PROCEDURE:  XI ROBOT ASSISTED EXTENDED RIGHT COLECTOMY   Surgeon(s): Romie Levee, MD Karie Soda, MD  ASSISTANT: Dr Michaell Cowing   ANESTHESIA:   local and general  EBL:24ml  Total I/O In: 100 [IV Piggyback:100] Out: -   Delay start of Pharmacological VTE agent (>24hrs) due to surgical blood loss or risk of bleeding:  no  DRAINS: none   SPECIMEN:  Source of Specimen:  Right colon with proximal transverse colon  DISPOSITION OF SPECIMEN:  PATHOLOGY  COUNTS:  YES  PLAN OF CARE: Admit to inpatient   PATIENT DISPOSITION:  PACU - hemodynamically stable.  INDICATION:    42 y.o. male who presented to the office after an episode of intussusception in his transverse colon.  Colonoscopy was performed and a large pedunculated polyp was resected from what was felt to be his distal transverse colon.  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  I recommended segmental resection:  The anatomy & physiology of the digestive tract was discussed.  The pathophysiology was discussed.  Natural history risks without surgery was discussed.   I worked to give an overview of the disease and the frequent need to have multispecialty involvement.  I feel the risks of no intervention will lead to serious problems that outweigh the operative risks; therefore, I recommended a partial colectomy to remove the pathology.  Laparoscopic & open techniques were discussed.   Risks such as bleeding, infection, abscess, leak, reoperation, possible ostomy, hernia, heart attack, death, and other risks were discussed.  I noted a good likelihood this will help address the problem.   Goals of post-operative  recovery were discussed as well.    The patient expressed understanding & wished to proceed with surgery.  OR FINDINGS: Mass noted approximately 5 cm distal transverse colon  Patient had tattoo noted in the proximal transverse colon  No obvious metastatic disease on visceral parietal peritoneum or liver.  DESCRIPTION:   Informed consent was confirmed.  The patient underwent general anaesthesia without difficulty.  The patient was positioned appropriately.  VTE prevention in place.  The patient's abdomen was clipped, prepped, & draped in a sterile fashion.  Surgical timeout confirmed our plan.  The patient was positioned in reverse Trendelenburg.  Abdominal entry was gained using a Veress needle in the left upper quadrant.  Entry was clean.  I induced carbon dioxide insufflation.  An 8mm robotic port was placed in the right upper quadrant.  Additional ports were placed under direct visualization in the right lateral and right lower quadrants. Camera inspection revealed no injury.  I laparoscopically evaluated the abdomen.  There was no tattoo noted in the distal colon, splenic flexure or descending colon.  I continued to evaluate proximally in the colon and the tattoo was noted at the level of the right branch of the middle colic artery.  I confirmed colonoscopy report that there were no other tattoos noted to be placed during colonoscopy.  We evaluated the CT again which showed intussusception in the distal portion of the colon.  We decided to perform a extended right colectomy instead of a segmental distal transverse colon resection.  Extra ports were carefully placed under direct laparoscopic visualization.  I laparoscopically reflected the greater omentum  and the upper abdomen the small bowel in the peilvis. The patient was appropriately positioned and the robot was docked to the patient's left side.  Instruments were placed under direct visualization.    I began by identifying the ileocolic  artery and vein within the mesentery. Dissection was bluntly carried around these structures. The duodenum was identified and free from the structures. I then separated the structures bluntly and used the robotic vessel sealer device to transect these.  I developed the retroperitoneal plane bluntly.  I then freed the appendix off its attachments to the pelvic wall. I mobilized the terminal ileum.  I took care to avoid injuring any retroperitoneal structures.  After this I began to mobilize laterally down the white line of Toldt and then took down the hepatic flexure using the Enseal device. I mobilized the omentum off of the proximal transverse colon. The entire colon was then flipped medially and mobilized off of the retroperitoneal structures until I could visualize the lateral edge of the duodenum underneath.  I gently freed the duodenal attachments.   I continued my dissection to approximately 8 to 10 cm distal to the tattoo.  I divided the right branch of the middle colic artery and continued my mesenteric dissection up to this level of the colon.  I divided the mesentery of the terminal ileum in similar fashion.  2 green load staplers were used to transect the transverse colon distal to the tattoo.  A blue load stapler was used to transect the terminal ileum.  This freed the specimen which was placed in the right upper quadrant.  We then aligned the small bowel with the remaining transverse colon in a isoperistaltic fashion.  A small enterotomy was made in both of these and a white load robotic 60 mm stapler was used to create an anastomosis robotically.  The common enterotomy channel was then closed using 2, 2-0 V-Loc sutures in a running canal fashion.  We also checked for perfusion with firefly, which showed adequate perfusion of the transverse colon and terminal ileum.  Once this was complete, the abdomen was irrigated and instruments and needles were removed.  The suprapubic port was converted into a  small Pfannenstiel incision and an Alexis wound protector was placed.  The colon was removed from this.  There was a mass palpated approximately 2 to 3 cm proximal to the staple line.  This was sent to pathology for further examination.  The peritoneum of the Pfannenstiel incision was closed using a running 0 Vicryl suture.  The fascia was closed using 2 running #1 Novafil sutures.  The subcutaneous tissue was reapproximated with a running 2-0 Vicryl suture and the skin was closed using a running 4-0 Vicryl suture.  A sterile dressing was applied.  The remaining port sites were closed using interrupted 4-0 Vicryl suture and Dermabond.  The patient was then awakened from anesthesia and sent to the postanesthesia care unit in stable condition.  All counts were correct per operating room staff.  Vanita Panda, MD  Colorectal and General Surgery [facility] Surgery

## 2022-10-27 NOTE — Progress Notes (Signed)
   10/27/22 1554  TOC Brief Assessment  Insurance and Status Reviewed  Patient has primary care physician Yes  Home environment has been reviewed Resides with spouse  Prior level of function: Independent at baseline  Prior/Current Home Services No current home services  Social Determinants of Health Reivew SDOH reviewed no interventions necessary  Readmission risk has been reviewed Yes  Transition of care needs no transition of care needs at this time

## 2022-10-27 NOTE — Transfer of Care (Signed)
Immediate Anesthesia Transfer of Care Note  Patient: Nathan Terrell  Procedure(s) Performed: XI ROBOT ASSISTED LAPAROSCOPIC EXTENDED RIGHT COLECTOMY  Patient Location: PACU  Anesthesia Type:General  Level of Consciousness: awake, alert , oriented, and patient cooperative  Airway & Oxygen Therapy: Patient Spontanous Breathing and Patient connected to face mask oxygen  Post-op Assessment: Report given to RN, Post -op Vital signs reviewed and stable, and Patient moving all extremities  Post vital signs: Reviewed and stable  Last Vitals:  Vitals Value Taken Time  BP 171/102 10/27/22 1118  Temp    Pulse 79 10/27/22 1120  Resp 19 10/27/22 1120  SpO2 99 % 10/27/22 1120  Vitals shown include unvalidated device data.  Last Pain:  Vitals:   10/27/22 0654  TempSrc:   PainSc: 0-No pain         Complications: No notable events documented.

## 2022-10-27 NOTE — Anesthesia Postprocedure Evaluation (Signed)
Anesthesia Post Note  Patient: Nathan Terrell  Procedure(s) Performed: XI ROBOT ASSISTED LAPAROSCOPIC EXTENDED RIGHT COLECTOMY     Patient location during evaluation: PACU Anesthesia Type: General Level of consciousness: awake Pain management: pain level controlled Vital Signs Assessment: post-procedure vital signs reviewed and stable Respiratory status: spontaneous breathing, nonlabored ventilation and respiratory function stable Cardiovascular status: blood pressure returned to baseline and stable Postop Assessment: no apparent nausea or vomiting Anesthetic complications: no   No notable events documented.  Last Vitals:  Vitals:   10/27/22 1348 10/27/22 1440  BP: 132/86 127/74  Pulse: 67 71  Resp: 17 17  Temp: (!) 36.4 C 36.7 C  SpO2: 98% 99%    Last Pain:  Vitals:   10/27/22 1440  TempSrc: Oral  PainSc:                  Zanyah Lentsch P Meleena Munroe

## 2022-10-27 NOTE — H&P (Signed)
REFERRING PHYSICIAN:  Theda Belfast, MD   PROVIDER:  Elenora Gamma, MD   MRN: [id number] DOB: Feb 07, 1981    Subjective       History of Present Illness: Nathan Terrell is a 42 y.o. male who is seen today as an office consultation at the request of Dr. Elnoria Terrell for evaluation of New Consultation ( large polyp seen on colonoscopy) .  42 year old male who has had trouble with crampy abdominal pain and diarrhea for several years.  This got worse and he was seen in urgent care.  They recommended follow-up in the emergency department at [facility] where a CT scan was done.  This showed intussusception of the colon.  He then underwent bowel prep and colonoscopy a few days later.  This showed a large transverse colon polyp that was resected piecemeal as well as a descending colon and ascending colon polyp.  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  The other polyps were tubular adenomas.  The area was tattooed proximally.  He is here today to discuss surgical resection.  He has had no past abdominal surgeries.     Review of Systems: A complete review of systems was obtained from the patient.  I have reviewed this information and discussed as appropriate with the patient.  See HPI as well for other ROS.       Medical History: History reviewed. No pertinent past medical history.   There is no problem list on file for this patient.     History reviewed. No pertinent surgical history.    No Known Allergies   No current outpatient medications on file prior to visit.    No current facility-administered medications on file prior to visit.      History reviewed. No pertinent family history.    Social History       Tobacco Use  Smoking Status Never  Smokeless Tobacco Never      Social History        Socioeconomic History   Marital status: Married  Tobacco Use   Smoking status:  Never   Smokeless tobacco: Never  Vaping Use   Vaping status: Never Used  Substance and Sexual Activity   Alcohol use: Yes   Drug use: Never    Social Determinants of Health        Financial Resource Strain: Low Risk  (06/07/2022)    Received from [facility], [facility]    Overall Financial Resource Strain (CARDIA)     Difficulty of Paying Living Expenses: Not hard at all  Food Insecurity: No Food Insecurity (06/29/2022)    Received from [facility], [place]    Hunger Vital Sign     Worried About Running Out of Food in the Last Year: Never true     Ran Out of Food in the Last Year: Never true  Transportation Needs: No Transportation Needs (06/29/2022)    Received from [facility], [place]    PRAPARE - Transportation     Lack of Transportation (Medical): No     Lack of Transportation (Non-Medical): No  Physical Activity: Sufficiently Active (06/07/2022)    Received from [facility], [facility]    Exercise Vital Sign     Days of Exercise per Week: 6 days     Minutes of Exercise per Session: 50 min  Stress: No Stress Concern Present (06/07/2022)    Received from [facility], [facility]  [employer] of Occupational Health - Occupational Stress Questionnaire     Feeling of Stress : Only a little  Social Connections: Socially Integrated (06/07/2022)    Received from [facility], [facility]    Social Network     How would you rate your social network (family, work, friends)?: Good participation with social networks  Housing Stability: Low Risk  (06/07/2022)    Received from [facility], [facility]    Housing Stability Vital Sign     Unable to Pay for Housing in the Last Year: No     Number of Places Lived in the Last Year: 1     In the last 12 months, was there a time when you did not have a steady place to sleep or slept in a shelter (including now)?: No      Objective:        Vitals:   10/27/22 0627  BP: 139/89  Pulse: 61  Resp: 13   Temp: 98 F (36.7 C)  SpO2: 95%    Exam Gen: NAD CV: RRR Lungs: CTA Abd: soft       Labs, Imaging and Diagnostic Testing: CT scan reviewed, CT report reviewed as well Colonoscopy report reviewed Pathology results reviewed.   Assessment and Plan:  Diagnoses and all orders for this visit:   Adenomatous polyp of transverse colon -     polyethylene glycol (MIRALAX) powder; Take 233.75 g by mouth once for 1 dose Take according to your procedure prep instructions. -     bisacodyL (DULCOLAX) 5 mg EC tablet; Take 4 tablets (20 mg total) by mouth once daily as needed for Constipation for up to 1 dose -     metroNIDAZOLE (FLAGYL) 500 MG tablet; Take 2 tablets (1,000 mg total) by mouth 3 (three) times daily for 3 doses Take according to your procedure colon prep instructions     42 year old healthy male with large colon polyp seen on colonoscopy and CT scan.  This appears to be in his distal transverse colon.  I have recommended a robotic assisted partial colectomy.  We have discussed this in detail.  All questions were answered. The surgery and anatomy were described to the patient as well as the risks of surgery and the possible complications.  These include: Bleeding, deep abdominal infections and possible wound complications such as hernia and infection, damage to adjacent structures, leak of surgical connections, which can lead to other surgeries and possibly an ostomy, possible need for other procedures, such as abscess drains in radiology, possible prolonged hospital stay, possible diarrhea from removal of part of the colon, possible constipation from narcotics, possible bowel, bladder or sexual dysfunction if having rectal surgery, prolonged fatigue/weakness or appetite loss, possible early recurrence of of disease, possible complications of their medical problems such as heart disease or arrhythmias or lung problems, death (less than 1%). I believe the patient understands and wishes to  proceed with the surgery.    Vanita Panda, MD Colon and Rectal Surgery [facility]

## 2022-10-27 NOTE — Anesthesia Preprocedure Evaluation (Addendum)
Anesthesia Evaluation  Patient identified by MRN, date of birth, ID band Patient awake    Reviewed: Allergy & Precautions, NPO status , Patient's Chart, lab work & pertinent test results  Airway Mallampati: II  TM Distance: >3 FB Neck ROM: Full    Dental no notable dental hx.    Pulmonary neg pulmonary ROS   Pulmonary exam normal        Cardiovascular negative cardio ROS Normal cardiovascular exam     Neuro/Psych negative neurological ROS  negative psych ROS   GI/Hepatic negative GI ROS,,,(+)     substance abuse    Endo/Other  negative endocrine ROS    Renal/GU negative Renal ROS     Musculoskeletal  (+) Arthritis ,    Abdominal   Peds  Hematology negative hematology ROS (+)   Anesthesia Other Findings ADENOMATHOUS POLYP TRANSVERS COLON  Reproductive/Obstetrics                             Anesthesia Physical Anesthesia Plan  ASA: 1  Anesthesia Plan: General   Post-op Pain Management:    Induction: Intravenous  PONV Risk Score and Plan: 3 and Ondansetron, Dexamethasone, Midazolam and Treatment may vary due to age or medical condition  Airway Management Planned: Oral ETT  Additional Equipment:   Intra-op Plan:   Post-operative Plan: Extubation in OR  Informed Consent: I have reviewed the patients History and Physical, chart, labs and discussed the procedure including the risks, benefits and alternatives for the proposed anesthesia with the patient or authorized representative who has indicated his/her understanding and acceptance.     Dental advisory given  Plan Discussed with: CRNA  Anesthesia Plan Comments:        Anesthesia Quick Evaluation

## 2022-10-27 NOTE — Anesthesia Procedure Notes (Signed)
Procedure Name: Intubation Date/Time: 10/27/2022 8:43 AM  Performed by: Johnette Abraham, CRNAPre-anesthesia Checklist: Patient identified, Emergency Drugs available, Suction available and Patient being monitored Patient Re-evaluated:Patient Re-evaluated prior to induction Oxygen Delivery Method: Circle System Utilized Preoxygenation: Pre-oxygenation with 100% oxygen Induction Type: IV induction Ventilation: Mask ventilation without difficulty Laryngoscope Size: Mac and 3 Grade View: Grade I Tube type: Oral Tube size: 7.0 mm Number of attempts: 1 Airway Equipment and Method: Stylet and Oral airway Placement Confirmation: ETT inserted through vocal cords under direct vision, positive ETCO2 and breath sounds checked- equal and bilateral Secured at: 23 cm Tube secured with: Tape Dental Injury: Teeth and Oropharynx as per pre-operative assessment

## 2022-10-28 LAB — CBC
HCT: 40 % (ref 39.0–52.0)
Hemoglobin: 13.2 g/dL (ref 13.0–17.0)
MCH: 29.9 pg (ref 26.0–34.0)
MCHC: 33 g/dL (ref 30.0–36.0)
MCV: 90.5 fL (ref 80.0–100.0)
Platelets: 174 10*3/uL (ref 150–400)
RBC: 4.42 MIL/uL (ref 4.22–5.81)
RDW: 13.1 % (ref 11.5–15.5)
WBC: 12.7 10*3/uL — ABNORMAL HIGH (ref 4.0–10.5)
nRBC: 0 % (ref 0.0–0.2)

## 2022-10-28 LAB — BASIC METABOLIC PANEL WITH GFR
Anion gap: 7 (ref 5–15)
BUN: 14 mg/dL (ref 6–20)
CO2: 26 mmol/L (ref 22–32)
Calcium: 8.4 mg/dL — ABNORMAL LOW (ref 8.9–10.3)
Chloride: 105 mmol/L (ref 98–111)
Creatinine, Ser: 1.25 mg/dL — ABNORMAL HIGH (ref 0.61–1.24)
GFR, Estimated: >60 mL/min
Glucose, Bld: 117 mg/dL — ABNORMAL HIGH (ref 70–99)
Potassium: 3.9 mmol/L (ref 3.5–5.1)
Sodium: 138 mmol/L (ref 135–145)

## 2022-10-28 NOTE — Plan of Care (Signed)
  Problem: Education: Goal: Understanding of discharge needs will improve Outcome: Not Progressing Goal: Verbalization of understanding of the causes of altered bowel function will improve Outcome: Not Progressing   Problem: Activity: Goal: Ability to tolerate increased activity will improve Outcome: Not Progressing   Problem: Bowel/Gastric: Goal: Gastrointestinal status for postoperative course will improve Outcome: Not Progressing   Problem: Health Behavior/Discharge Planning: Goal: Identification of community resources to assist with postoperative recovery needs will improve Outcome: Not Progressing   Problem: Nutritional: Goal: Will attain and maintain optimal nutritional status will improve Outcome: Not Progressing   Problem: Clinical Measurements: Goal: Postoperative complications will be avoided or minimized Outcome: Not Progressing   Problem: Respiratory: Goal: Respiratory status will improve Outcome: Not Progressing   Problem: Skin Integrity: Goal: Will show signs of wound healing Outcome: Not Progressing   Problem: Education: Goal: Knowledge of General Education information will improve Description: Including pain rating scale, medication(s)/side effects and non-pharmacologic comfort measures Outcome: Not Progressing   Problem: Health Behavior/Discharge Planning: Goal: Ability to manage health-related needs will improve Outcome: Not Progressing   Problem: Clinical Measurements: Goal: Ability to maintain clinical measurements within normal limits will improve Outcome: Not Progressing Goal: Will remain free from infection Outcome: Not Progressing Goal: Diagnostic test results will improve Outcome: Not Progressing Goal: Respiratory complications will improve Outcome: Not Progressing Goal: Cardiovascular complication will be avoided Outcome: Not Progressing   Problem: Activity: Goal: Risk for activity intolerance will decrease Outcome: Not Progressing    Problem: Nutrition: Goal: Adequate nutrition will be maintained Outcome: Not Progressing   Problem: Coping: Goal: Level of anxiety will decrease Outcome: Not Progressing   Problem: Elimination: Goal: Will not experience complications related to bowel motility Outcome: Not Progressing Goal: Will not experience complications related to urinary retention Outcome: Not Progressing   Problem: Pain Managment: Goal: General experience of comfort will improve Outcome: Not Progressing   Problem: Safety: Goal: Ability to remain free from injury will improve Outcome: Not Progressing   Problem: Skin Integrity: Goal: Risk for impaired skin integrity will decrease Outcome: Not Progressing

## 2022-10-28 NOTE — Progress Notes (Signed)
1 Day Post-Op Robotic extended R colectomy Subjective: Had a bloody BM last night, tolerating clears, c/o shoulder pain and abd soreness  Objective: Vital signs in last 24 hours: Temp:  [97.5 F (36.4 C)-100.8 F (38.2 C)] 99 F (37.2 C) (07/11 0527) Pulse Rate:  [58-89] 74 (07/11 0527) Resp:  [12-18] 18 (07/11 0527) BP: (118-171)/(74-102) 131/79 (07/11 0527) SpO2:  [96 %-100 %] 96 % (07/11 0527) Weight:  [87.1 kg] 87.1 kg (07/10 1300)   Intake/Output from previous day: 07/10 0701 - 07/11 0700 In: 3085.1 [P.O.:360; I.V.:2625.1; IV Piggyback:100] Out: 2050 [Urine:2000; Blood:50] Intake/Output this shift: No intake/output data recorded.   General appearance: alert and cooperative GI: non-distended  Incision: no significant drainage  Lab Results:  Recent Labs    10/28/22 0500  WBC 12.7*  HGB 13.2  HCT 40.0  PLT 174   BMET Recent Labs    10/28/22 0500  NA 138  K 3.9  CL 105  CO2 26  GLUCOSE 117*  BUN 14  CREATININE 1.25*  CALCIUM 8.4*   PT/INR No results for input(s): "LABPROT", "INR" in the last 72 hours. ABG No results for input(s): "PHART", "HCO3" in the last 72 hours.  Invalid input(s): "PCO2", "PO2"  MEDS, Scheduled  acetaminophen  1,000 mg Oral Q6H   alvimopan  12 mg Oral BID   enoxaparin (LOVENOX) injection  40 mg Subcutaneous Q24H   feeding supplement  237 mL Oral BID BM   gabapentin  300 mg Oral BID   saccharomyces boulardii  250 mg Oral BID    Studies/Results: No results found.  Assessment: s/p Procedure(s): XI ROBOT ASSISTED LAPAROSCOPIC EXTENDED RIGHT COLECTOMY Patient Active Problem List   Diagnosis Date Noted   Colon polyp 10/27/2022   Adenomatous polyps 08/17/2022   Family history of polyps in the colon 08/17/2022   Polyp, colonic 07/01/2022   Norovirus 07/01/2022   SBO (small bowel obstruction) (HCC) 06/30/2022   Intussusception of colon (HCC) 06/29/2022    Expected post op course  Plan: d/c foley Advance diet to  fulls SL IVFs Ambulate in hall Monitor BMs for resolution of bleeding, Hgb ok today.  Recheck in AM   LOS: 1 day     .Vanita Panda, MD [facility], [place]    10/28/2022 7:43 AM

## 2022-10-28 NOTE — Progress Notes (Signed)
Patient had a large loose maroon color with some bright red blood. Patient stable no s/s of dizziness or syncopal episodes. Patient has been c/o right shoulder pain. Patient noted the pain once he was up and walking around. Hot packs were given to help with muscle soreness.

## 2022-10-29 LAB — CBC
HCT: 40.9 % (ref 39.0–52.0)
Hemoglobin: 13.4 g/dL (ref 13.0–17.0)
MCH: 30.2 pg (ref 26.0–34.0)
MCHC: 32.8 g/dL (ref 30.0–36.0)
MCV: 92.1 fL (ref 80.0–100.0)
Platelets: 180 10*3/uL (ref 150–400)
RBC: 4.44 MIL/uL (ref 4.22–5.81)
RDW: 13.2 % (ref 11.5–15.5)
WBC: 11.4 10*3/uL — ABNORMAL HIGH (ref 4.0–10.5)
nRBC: 0 % (ref 0.0–0.2)

## 2022-10-29 LAB — BASIC METABOLIC PANEL
Anion gap: 5 (ref 5–15)
BUN: 13 mg/dL (ref 6–20)
CO2: 27 mmol/L (ref 22–32)
Calcium: 8.5 mg/dL — ABNORMAL LOW (ref 8.9–10.3)
Chloride: 104 mmol/L (ref 98–111)
Creatinine, Ser: 1.18 mg/dL (ref 0.61–1.24)
GFR, Estimated: >60 mL/min (ref >60)
Glucose, Bld: 111 mg/dL — ABNORMAL HIGH (ref 70–99)
Potassium: 4.1 mmol/L (ref 3.5–5.1)
Sodium: 136 mmol/L (ref 135–145)

## 2022-10-29 MED ORDER — HYDROMORPHONE HCL 1 MG/ML IJ SOLN
0.5000 mg | INTRAMUSCULAR | Status: DC | PRN
  Administered 2022-10-29: 0.5 mg via INTRAVENOUS
  Filled 2022-10-29 (×2): qty 0.5

## 2022-10-29 MED ORDER — OXYCODONE HCL 5 MG PO TABS
5.0000 mg | ORAL_TABLET | Freq: Four times a day (QID) | ORAL | Status: DC | PRN
  Administered 2022-10-29 (×2): 10 mg via ORAL
  Filled 2022-10-29 (×3): qty 2

## 2022-10-29 NOTE — Progress Notes (Signed)
2 Days Post-Op Robotic extended R colectomy Subjective: BM's becoming less bloody, tolerating soft diet, c/o less shoulder pain but more abd soreness that started last night  Objective: Vital signs in last 24 hours: Temp:  [98.5 F (36.9 C)-99.4 F (37.4 C)] 98.5 F (36.9 C) (07/12 0424) Pulse Rate:  [71-86] 71 (07/12 0424) Resp:  [17-20] 20 (07/12 0424) BP: (143-158)/(90-99) 143/96 (07/12 0424) SpO2:  [96 %-99 %] 99 % (07/12 0424) Weight:  [90.6 kg] 90.6 kg (07/11 0820)   Intake/Output from previous day: 07/11 0701 - 07/12 0700 In: 1141.2 [P.O.:960; I.V.:181.2] Out: -  Intake/Output this shift: No intake/output data recorded.   General appearance: alert and cooperative GI: non-distended  Incision: no significant drainage  Lab Results:  Recent Labs    10/28/22 0500 10/29/22 0417  WBC 12.7* 11.4*  HGB 13.2 13.4  HCT 40.0 40.9  PLT 174 180   BMET Recent Labs    10/28/22 0500 10/29/22 0417  NA 138 136  K 3.9 4.1  CL 105 104  CO2 26 27  GLUCOSE 117* 111*  BUN 14 13  CREATININE 1.25* 1.18  CALCIUM 8.4* 8.5*   PT/INR No results for input(s): "LABPROT", "INR" in the last 72 hours. ABG No results for input(s): "PHART", "HCO3" in the last 72 hours.  Invalid input(s): "PCO2", "PO2"  MEDS, Scheduled  acetaminophen  1,000 mg Oral Q6H   enoxaparin (LOVENOX) injection  40 mg Subcutaneous Q24H   feeding supplement  237 mL Oral BID BM   gabapentin  300 mg Oral BID   saccharomyces boulardii  250 mg Oral BID    Studies/Results: No results found.  Assessment: s/p Procedure(s): XI ROBOT ASSISTED LAPAROSCOPIC EXTENDED RIGHT COLECTOMY Patient Active Problem List   Diagnosis Date Noted   Colon polyp 10/27/2022   Adenomatous polyps 08/17/2022   Family history of polyps in the colon 08/17/2022   Polyp, colonic 07/01/2022   Norovirus 07/01/2022   SBO (small bowel obstruction) (HCC) 06/30/2022   Intussusception of colon (HCC) 06/29/2022    Expected post op  course  Plan: d/c foley Cont soft diet SL IVFs Ambulate in hall Hgb stable Work on pain control today   LOS: 2 days     .Vanita Panda, MD [facility], [place]    10/29/2022 7:43 AM

## 2022-10-29 NOTE — Plan of Care (Signed)
  Problem: Education: Goal: Understanding of discharge needs will improve Outcome: Progressing   Problem: Education: Goal: Verbalization of understanding of the causes of altered bowel function will improve Outcome: Progressing   Problem: Activity: Goal: Ability to tolerate increased activity will improve Outcome: Progressing   Problem: Bowel/Gastric: Goal: Gastrointestinal status for postoperative course will improve Outcome: Progressing

## 2022-10-29 NOTE — Discharge Instructions (Signed)
SURGERY: POST OP INSTRUCTIONS (Surgery for small bowel obstruction, colon resection, etc)   ######################################################################  EAT Gradually transition to a high fiber diet with a fiber supplement over the next few days after discharge  WALK Walk an hour a day.  Control your pain to do that.    CONTROL PAIN Control pain so that you can walk, sleep, tolerate sneezing/coughing, go up/down stairs.  HAVE A BOWEL MOVEMENT DAILY Keep your bowels regular to avoid problems.  OK to try a laxative to override constipation.  OK to use an antidairrheal to slow down diarrhea.  Call if not better after 2 tries  CALL IF YOU HAVE PROBLEMS/CONCERNS Call if you are still struggling despite following these instructions. Call if you have concerns not answered by these instructions  ######################################################################   DIET Follow a light diet the first few days at home.  Start with a bland diet such as soups, liquids, starchy foods, low fat foods, etc.  If you feel full, bloated, or constipated, stay on a ful liquid or pureed/blenderized diet for a few days until you feel better and no longer constipated. Be sure to drink plenty of fluids every day to avoid getting dehydrated (feeling dizzy, not urinating, etc.). Gradually add a fiber supplement to your diet over the next week.  Gradually get back to a regular solid diet.  Avoid fast food or heavy meals the first week as you are more likely to get nauseated. It is expected for your digestive tract to need a few months to get back to normal.  It is common for your bowel movements and stools to be irregular.  You will have occasional bloating and cramping that should eventually fade away.  Until you are eating solid food normally, off all pain medications, and back to regular activities; your bowels will not be normal. Focus on eating a low-fat, high fiber diet the rest of your life  (See Getting to Good Bowel Health, below).  CARE of your INCISION or WOUND  It is good for closed incisions and even open wounds to be washed every day.  Shower every day.  Short baths are fine.  Wash the incisions and wounds clean with soap & water.    You may leave closed incisions open to air if it is dry.   You may cover the incision with clean gauze & replace it after your daily shower for comfort.  STAPLES: You have skin staples.  Leave them in place & set up an appointment for them to be removed by a surgery office nurse ~10 days after surgery. = 1st week of January 2024    ACTIVITIES as tolerated Start light daily activities --- self-care, walking, climbing stairs-- beginning the day after surgery.  Gradually increase activities as tolerated.  Control your pain to be active.  Stop when you are tired.  Ideally, walk several times a day, eventually an hour a day.   Most people are back to most day-to-day activities in a few weeks.  It takes 4-8 weeks to get back to unrestricted, intense activity. If you can walk 30 minutes without difficulty, it is safe to try more intense activity such as jogging, treadmill, bicycling, low-impact aerobics, swimming, etc. Save the most intensive and strenuous activity for last (Usually 4-8 weeks after surgery) such as sit-ups, heavy lifting, contact sports, etc.  Refrain from any intense heavy lifting or straining until you are off narcotics for pain control.  You will have off days, but things should improve   week-by-week. DO NOT PUSH THROUGH PAIN.  Let pain be your guide: If it hurts to do something, don't do it.  Pain is your body warning you to avoid that activity for another week until the pain goes down. You may drive when you are no longer taking narcotic prescription pain medication, you can comfortably wear a seatbelt, and you can safely make sudden turns/stops to protect yourself without hesitating due to pain. You may have sexual intercourse when it  is comfortable. If it hurts to do something, stop.  MEDICATIONS Take your usually prescribed home medications unless otherwise directed.   Blood thinners:  Usually you can restart any strong blood thinners after the second postoperative day.  It is OK to take aspirin right away.     If you are on strong blood thinners (warfarin/Coumadin, Plavix, Xerelto, Eliquis, Pradaxa, etc), discuss with your surgeon, medicine PCP, and/or cardiologist for instructions on when to restart the blood thinner & if blood monitoring is needed (PT/INR blood check, etc).     PAIN CONTROL Pain after surgery or related to activity is often due to strain/injury to muscle, tendon, nerves and/or incisions.  This pain is usually short-term and will improve in a few months.  To help speed the process of healing and to get back to regular activity more quickly, DO THE FOLLOWING THINGS TOGETHER: Increase activity gradually.  DO NOT PUSH THROUGH PAIN Use Ice and/or Heat Try Gentle Massage and/or Stretching Take over the counter pain medication Take Narcotic prescription pain medication for more severe pain  Good pain control = faster recovery.  It is better to take more medicine to be more active than to stay in bed all day to avoid medications.  Increase activity gradually Avoid heavy lifting at first, then increase to lifting as tolerated over the next 6 weeks. Do not "push through" the pain.  Listen to your body and avoid positions and maneuvers than reproduce the pain.  Wait a few days before trying something more intense Walking an hour a day is encouraged to help your body recover faster and more safely.  Start slowly and stop when getting sore.  If you can walk 30 minutes without stopping or pain, you can try more intense activity (running, jogging, aerobics, cycling, swimming, treadmill, sex, sports, weightlifting, etc.) Remember: If it hurts to do it, then don't do it! Use Ice and/or Heat You will have swelling and  bruising around the incisions.  This will take several weeks to resolve. Ice packs or heating pads (6-8 times a day, 30-60 minutes at a time) will help sooth soreness & bruising. Some people prefer to use ice alone, heat alone, or alternate between ice & heat.  Experiment and see what works best for you.  Consider trying ice for the first few days to help decrease swelling and bruising; then, switch to heat to help relax sore spots and speed recovery. Shower every day.  Short baths are fine.  It feels good!  Keep the incisions and wounds clean with soap & water.   Try Gentle Massage and/or Stretching Massage at the area of pain many times a day Stop if you feel pain - do not overdo it Take over the counter pain medication This helps the muscle and nerve tissues become less irritable and calm down faster Choose ONE of the following over-the-counter anti-inflammatory medications: Acetaminophen 500mg tabs (Tylenol) 1-2 pills with every meal and just before bedtime (avoid if you have liver problems or if you have   acetaminophen in you narcotic prescription) Naproxen 220mg tabs (ex. Aleve, Naprosyn) 1-2 pills twice a day (avoid if you have kidney, stomach, IBD, or bleeding problems) Ibuprofen 200mg tabs (ex. Advil, Motrin) 3-4 pills with every meal and just before bedtime (avoid if you have kidney, stomach, IBD, or bleeding problems) Take with food/snack several times a day as directed for at least 2 weeks to help keep pain / soreness down & more manageable. Take Narcotic prescription pain medication for more severe pain A prescription for strong pain control is often given to you upon discharge (for example: oxycodone/Percocet, hydrocodone/Norco/Vicodin, or tramadol/Ultram) Take your pain medication as prescribed. Be mindful that most narcotic prescriptions contain Tylenol (acetaminophen) as well - avoid taking too much Tylenol. If you are having problems/concerns with the prescription medicine (does  not control pain, nausea, vomiting, rash, itching, etc.), please call us [phone] to see if we need to switch you to a different pain medicine that will work better for you and/or control your side effects better. If you need a refill on your pain medication, you must call the office before 4 pm and on weekdays only.  By federal law, prescriptions for narcotics cannot be called into a pharmacy.  They must be filled out on paper & picked up from our office by the patient or authorized caretaker.  Prescriptions cannot be filled after 4 pm nor on weekends.    WHEN TO CALL US [phone] Severe uncontrolled or worsening pain  Fever over 101 F (38.5 C) Concerns with the incision: Worsening pain, redness, rash/hives, swelling, bleeding, or drainage Reactions / problems with new medications (itching, rash, hives, nausea, etc.) Nausea and/or vomiting Difficulty urinating Difficulty breathing Worsening fatigue, dizziness, lightheadedness, blurred vision Other concerns If you are not getting better after two weeks or are noticing you are getting worse, contact our office [phone] for further advice.  We may need to adjust your medications, re-evaluate you in the office, send you to the emergency room, or see what other things we can do to help. The clinic staff is available to answer your questions during regular business hours (8:30am-5pm).  Please don't hesitate to call and ask to speak to one of our nurses for clinical concerns.    A surgeon from [facility] is always on call at the hospitals 24 hours/day If you have a medical emergency, go to the nearest emergency room or call 911.  FOLLOW UP in our office One the day of your discharge from the hospital (or the next business weekday), please call [facility] to set up or confirm an appointment to see your surgeon in the office for a follow-up appointment.  Usually it is 2-3 weeks after your surgery.   If you  have skin staples at your incision(s), let the office know so we can set up a time in the office for the nurse to remove them (usually around 10 days after surgery). Make sure that you call for appointments the day of discharge (or the next business weekday) from the hospital to ensure a convenient appointment time. IF YOU HAVE DISABILITY OR FAMILY LEAVE FORMS, BRING THEM TO THE OFFICE FOR PROCESSING.  DO NOT GIVE THEM TO YOUR DOCTOR.  [facility] [address], [address], [place], [place]  [address] ? [phone] - Main [phone] - Toll Free,  [phone] - Fax [email or web address]    GETTING TO GOOD BOWEL HEALTH. It is expected for your digestive tract to   need a few months to get back to normal.  It is common for your bowel movements and stools to be irregular.  You will have occasional bloating and cramping that should eventually fade away.  Until you are eating solid food normally, off all pain medications, and back to regular activities; your bowels will not be normal.   Avoiding constipation The goal: ONE SOFT BOWEL MOVEMENT A DAY!    Drink plenty of fluids.  Choose water first. TAKE A FIBER SUPPLEMENT EVERY DAY THE REST OF YOUR LIFE During your first week back home, gradually add back a fiber supplement every day Experiment which form you can tolerate.   There are many forms such as powders, tablets, wafers, gummies, etc Psyllium bran (Metamucil), methylcellulose (Citrucel), Miralax or Glycolax, Benefiber, Flax Seed.  Adjust the dose week-by-week (1/2 dose/day to 6 doses a day) until you are moving your bowels 1-2 times a day.  Cut back the dose or try a different fiber product if it is giving you problems such as diarrhea or bloating. Sometimes a laxative is needed to help jump-start bowels if constipated until the fiber supplement can help regulate your bowels.  If you are tolerating eating & you are farting, it is okay to try a gentle  laxative such as double dose MiraLax, prune juice, or Milk of Magnesia.  Avoid using laxatives too often. Stool softeners can sometimes help counteract the constipating effects of narcotic pain medicines.  It can also cause diarrhea, so avoid using for too long. If you are still constipated despite taking fiber daily, eating solids, and a few doses of laxatives, call our office. Controlling diarrhea Try drinking liquids and eating bland foods for a few days to avoid stressing your intestines further. Avoid dairy products (especially milk & ice cream) for a short time.  The intestines often can lose the ability to digest lactose when stressed. Avoid foods that cause gassiness or bloating.  Typical foods include beans and other legumes, cabbage, broccoli, and dairy foods.  Avoid greasy, spicy, fast foods.  Every person has some sensitivity to other foods, so listen to your body and avoid those foods that trigger problems for you. Probiotics (such as active yogurt, Align, etc) may help repopulate the intestines and colon with normal bacteria and calm down a sensitive digestive tract Adding a fiber supplement gradually can help thicken stools by absorbing excess fluid and retrain the intestines to act more normally.  Slowly increase the dose over a few weeks.  Too much fiber too soon can backfire and cause cramping & bloating. It is okay to try and slow down diarrhea with a few doses of antidiarrheal medicines.   Bismuth subsalicylate (ex. Kayopectate, Pepto Bismol) for a few doses can help control diarrhea.  Avoid if pregnant.   Loperamide (Imodium) can slow down diarrhea.  Start with one tablet (2mg) first.  Avoid if you are having fevers or severe pain.  ILEOSTOMY PATIENTS WILL HAVE CHRONIC DIARRHEA since their colon is not in use.    Drink plenty of liquids.  You will need to drink even more glasses of water/liquid a day to avoid getting dehydrated. Record output from your ileostomy.  Expect to empty  the bag every 3-4 hours at first.  Most people with a permanent ileostomy empty their bag 4-6 times at the least.   Use antidiarrheal medicine (especially Imodium) several times a day to avoid getting dehydrated.  Start with a dose at bedtime & breakfast.  Adjust up or   down as needed.  Increase antidiarrheal medications as directed to avoid emptying the bag more than 8 times a day (every 3 hours). Work with your wound ostomy nurse to learn care for your ostomy.  See ostomy care instructions. TROUBLESHOOTING IRREGULAR BOWELS 1) Start with a soft & bland diet. No spicy, greasy, or fried foods.  2) Avoid gluten/wheat or dairy products from diet to see if symptoms improve. 3) Miralax 17gm or flax seed mixed in 8oz. water or juice-daily. May use 2-4 times a day as needed. 4) Gas-X, Phazyme, etc. as needed for gas & bloating.  5) Prilosec (omeprazole) over-the-counter as needed 6)  Consider probiotics (Align, Activa, etc) to help calm the bowels down  Call your doctor if you are getting worse or not getting better.  Sometimes further testing (cultures, endoscopy, X-ray studies, CT scans, bloodwork, etc.) may be needed to help diagnose and treat the cause of the diarrhea. [facility] [address], [address], [place], [place]  [address] [phone] - Main.    [phone]  - Toll Free.   [phone] - Fax [email or web address]   ###############################   #######################################################  Ostomy Support Information  You've heard that people get along just fine with only one of their eyes, or one of their lungs, or one of their kidneys. But you also know that you have only one intestine and only one bladder, and that leaves you feeling awfully empty, both physically and emotionally: You think no other people go around without part of their intestine with the ends of their intestines sticking out through their abdominal walls.    YOU ARE NOT ALONE.  There are nearly three quarters of a million people in the [place] who have an ostomy; people who have had surgery to remove all or part of their colons or bladders.   There is even a national association, the [employer] with over 350 local affiliated support groups that are organized by volunteers who provide peer support and counseling. [employer] has a toll free telephone num-ber, 800-826-0826 and an educational, interactive website, www.ostomy.org   An ostomy is an opening in the belly (abdominal wall) made by surgery. Ostomates are people who have had this procedure. The opening (stoma) allows the kidney or bowel to grdischarge waste. An external pouch covers the stoma to collect waste. Pouches are are a simple bag and are odor free. Different companies have disposable or reusable pouches to fit one's lifestyle. An ostomy can either be temporary or permanent.   THERE ARE THREE MAIN TYPES OF OSTOMIES Colostomy. A colostomy is a surgically created opening in the large intestine (colon). Ileostomy. An ileostomy is a surgically created opening in the small intestine. Urostomy. A urostomy is a surgically created opening to divert urine away from the bladder.  OSTOMY Care  The following guidelines will make care of your colostomy easier. Keep this information close by for quick reference.  Helpful DIET hints Eat a well-balanced diet including vegetables and fresh fruits. Eat on a regular schedule.  Drink at least 6 to 8 glasses of fluids daily. Eat slowly in a relaxed atmosphere. Chew your food thoroughly. Avoid chewing gum, smoking, and drinking from a straw. This will help decrease the amount of air you swallow, which may help reduce gas. Eating yogurt or drinking buttermilk may help reduce gas.  To control gas at night, do not eat after 8 p.m. This will give your bowel time to quiet down before you go   to bed.  If gas is a problem, you can purchase  Beano. Sprinkle Beano on the first bite of food before eating to reduce gas. It has no flavor and should not change the taste of your food. You can buy Beano over the counter at your local drugstore.  Foods like fish, onions, garlic, broccoli, asparagus, and cabbage produce odor. Although your pouch is odor-proof, if you eat these foods you may notice a stronger odor when emptying your pouch. If this is a concern, you may want to limit these foods in your diet.  If you have an ileostomy, you will have chronic diarrhea & need to drink more liquids to avoid getting dehydrated.  Consider antidiarrheal medicine like imodium (loperamide) or Lomotil to help slow down bowel movements / diarrhea into your ileostomy bag.  GETTING TO GOOD BOWEL HEALTH WITH AN ILEOSTOMY    With the colon bypassed & not in use, you will have small bowel diarrhea.   It is important to thicken & slow your bowel movements down.   The goal: 4-6 small BOWEL MOVEMENTS A DAY It is important to drink plenty of liquids to avoid getting dehydrated  CONTROLLING ILEOSTOMY DIARRHEA  TAKE A FIBER SUPPLEMENT (FiberCon or Benefiner soluble fiber) twice a day - to thicken stools by absorbing excess fluid and retrain the intestines to act more normally.  Slowly increase the dose over a few weeks.  Too much fiber too soon can backfire and cause cramping & bloating.  TAKE AN IRON SUPPLEMENT twice a day to naturally constipate your bowels.  Usually ferrous sulfate 325mg twice a day)  TAKE ANTI-DIARRHEAL MEDICINES: Loperamide (Imodium) can slow down diarrhea.  Start with two tablets (= 4mg) first and then try one tablet every 6 hours.  Can go up to 2 pills four times day (8 pills of 2mg max) Avoid if you are having fevers or severe pain.  If you are not better or start feeling worse, stop all medicines and call your doctor for advice LoMotil (Diphenoxylate / Atropine) is another medicine that can constipate & slow down bowel moevements Pepto  Bismol (bismuth) can gently thicken bowels as well  If diarrhea is worse,: drink plenty of liquids and try simpler foods for a few days to avoid stressing your intestines further. Avoid dairy products (especially milk & ice cream) for a short time.  The intestines often can lose the ability to digest lactose when stressed. Avoid foods that cause gassiness or bloating.  Typical foods include beans and other legumes, cabbage, broccoli, and dairy foods.  Every person has some sensitivity to other foods, so listen to our body and avoid those foods that trigger problems for you.Call your doctor if you are getting worse or not better.  Sometimes further testing (cultures, endoscopy, X-ray studies, bloodwork, etc) may be needed to help diagnose and treat the cause of the diarrhea. Take extra anti-diarrheal medicines (maximum is 8 pills of 2mg loperamide a day)   Tips for POUCHING an OSTOMY   Changing Your Pouch The best time to change your pouch is in the morning, before eating or drinking anything. Your stoma can function at any time, but it will function more after eating or drinking.   Applying the pouching system  Place all your equipment close at hand before removing your pouch.  Wash your hands.  Stand or sit in front of a mirror. Use the position that works best for you. Remember that you must keep the skin around the stoma   wrinkle-free for a good seal.  Gently remove the used pouch (1-piece system) or the pouch and old wafer (2-piece system). Empty the pouch into the toilet. Save the closure clip to use again.  Wash the stoma itself and the skin around the stoma. Your stoma may bleed a little when being washed. This is normal. Rinse and pat dry. You may use a wash cloth or soft paper towels (like Bounty), mild soap (like Dial, Safeguard, or Ivory), and water. Avoid soaps that contain perfumes or lotions.  For a new pouch (1-piece system) or a new wafer (2-piece system), measure your  stoma using the stoma guide in each box of supplies.  Trace the shape of your stoma onto the back of the new pouch or the back of the new wafer. Cut out the opening. Remove the paper backing and set it aside.  Optional: Apply a skin barrier powder to surrounding skin if it is irritated (bare or weeping), and dust off the excess. Optional: Apply a skin-prep wipe (such as Skin Prep or All-Kare) to the skin around the stoma, and let it dry. Do not apply this solution if the skin is irritated (red, tender, or broken) or if you have shaved around the stoma. Optional: Apply a skin barrier paste (such as Stomahesive, Coloplast, or Premium) around the opening cut in the back of the pouch or wafer. Allow it to dry for 30 to 60 seconds.  Hold the pouch (1-piece system) or wafer (2-piece system) with the sticky side toward your body. Make sure the skin around the stoma is wrinkle-free. Center the opening on the stoma, then press firmly to your abdomen (Fig. 4). Look in the mirror to check if you are placing the pouch, or wafer, in the right position. For a 2-piece system, snap the pouch onto the wafer. Make sure it snaps into place securely.  Place your hand over the stoma and the pouch or wafer for about 30 seconds. The heat from your hand can help the pouch or wafer stick to your skin.  Add deodorant (such as Super Banish or Nullo) to your pouch. Other options include food extracts such as vanilla oil and peppermint extract. Add about 10 drops of the deodorant to the pouch. Then apply the closure clamp. Note: Do not use toxic  chemicals or commercial cleaning agents in your pouch. These substances may harm the stoma.  Optional: For extra seal, apply tape to all 4 sides around the pouch or wafer, as if you were framing a picture. You may use any brand of medical adhesive tape. Change your pouch every 5 to 7 days. Change it immediately if a leak occurs.  Wash your hands afterwards.  If you are wearing a  2-piece system, you may use 2 new pouches per week and alternate them. Rinse the pouch with mild soap and warm water and hang it to dry for the next day. Apply the fresh pouch. Alternate the 2 pouches like this for a week. After a week, change the wafer and begin with 2 new pouches. Place the old pouches in a plastic bag, and put them in the trash.   LIVING WITH AN OSTOMY  Emptying Your Pouch Empty your pouch when it is one-third full (of urine, stool, and/or gas). If you wait until your pouch is fuller than this, it will be more difficult to empty and more noticeable. When you empty your pouch, either put toilet paper in the toilet bowl first, or flush the   toilet while you empty the pouch. This will reduce splashing. You can empty the pouch between your legs or to one side while sitting, or while standing or stooping. If you have a 2-piece system, you can snap off the pouch to empty it. Remember that your stoma may function during this time. If you wish to rinse your pouch after you empty it, a turkey baster can be helpful. When using a baster, squirt water up into the pouch through the opening at the bottom. With a 2-piece system, you can snap off the pouch to rinse it. After rinsing  your pouch, empty it into the toilet. When rinsing your pouch at home, put a few granules of Dreft soap in the rinse water. This helps lubricate and freshen your pouch. The inside of your pouch can be sprayed with non-stick cooking oil (Pam spray). This may help reduce stool sticking to the inside of the pouch.  Bathing You may shower or bathe with your pouch on or off. Remember that your stoma may function during this time.  The materials you use to wash your stoma and the skin around it should be clean, but they do not need to be sterile.  Wearing Your Pouch During hot weather, or if you perspire a lot in general, wear a cover over your pouch. This may prevent a rash on your skin under the pouch. Pouch covers are  sold at ostomy supply stores. Wear the pouch inside your underwear for better support. Watch your weight. Any gain or loss of 10 to 15 pounds or more can change the way your pouch fits.  Going Away From Home A collapsible cup (like those that come in travel kits) or a soft plastic squirt bottle with a pull-up top (like a travel bottle for shampoo) can be used for rinsing your pouch when you are away from home. Tilt the opening of the pouch at an upward angle when using a cup to rinse.  Carry wet wipes or extra tissues to use in public bathrooms.  Carry an extra pouching system with you at all times.  Never keep ostomy supplies in the glove compartment of your car. Extreme heat or cold can damage the skin barriers and adhesive wafers on the pouch.  When you travel, carry your ostomy supplies with you at all times. Keep them within easy reach. Do not pack ostomy supplies in baggage that will be checked or otherwise separated from you, because your baggage might be lost. If you're traveling out of the country, it is helpful to have a letter stating that you are carrying ostomy supplies as a medical necessity.  If you need ostomy supplies while traveling, look in the yellow pages of the telephone book under "Surgical Supplies." Or call the local ostomy organization to find out where supplies are available.  Do not let your ostomy supplies get low. Always order new pouches before you use the last one.  Reducing Odor Limit foods such as broccoli, cabbage, onions, fish, and garlic in your diet to help reduce odor. Each time you empty your pouch, carefully clean the opening of the pouch, both inside and outside, with toilet paper. Rinse your pouch 1 or 2 times daily after you empty it (see directions for emptying your pouch and going away from home). Add deodorant (such as Super Banish or Nullo) to your pouch. Use air deodorizers in your bathroom. Do not add aspirin to your pouch. Even though  aspirin can help prevent odor, it   could cause ulcers on your stoma.  When to call the doctor Call the doctor if you have any of the following symptoms: Purple, black, or white stoma Severe cramps lasting more than 6 hours Severe watery discharge from the stoma lasting more than 6 hours No output from the colostomy for 3 days Excessive bleeding from your stoma Swelling of your stoma to more than 1/2-inch larger than usual Pulling inward of your stoma below skin level Severe skin irritation or deep ulcers Bulging or other changes in your abdomen  When to call your ostomy nurse Call your ostomy/enterostomal therapy (WOCN) nurse if any of the following occurs: Frequent leaking of your pouching system Change in size or appearance of your stoma, causing discomfort or problems with your pouch Skin rash or rawness Weight gain or loss that causes problems with your pouch     FREQUENTLY ASKED QUESTIONS   Why haven't you met any of these folks who have an ostomy?  Well, maybe you have! You just did not recognize them because an ostomy doesn't show. It can be kept secret if you wish. Why, maybe some of your best friends, office associates or neighbors have an ostomy ... you never can tell. People facing ostomy surgery have many quality-of-life questions like: Will you bulge? Smell? Make noises? Will you feel waste leaving your body? Will you be a captive of the toilet? Will you starve? Be a social outcast? Get/stay married? Have babies? Easily bathe, go swimming, bend over?  OK, let's look at what you can expect:   Will you bulge?  Remember, without part of the intestine or bladder, and its contents, you should have a flatter tummy than before. You can expect to wear, with little exception, what you wore before surgery ... and this in-cludes tight clothing and bathing suits.   Will you smell?  Today, thanks to modern odor proof pouching systems, you can walk into an ostomy support group  meeting and not smell anything that is foul or offensive. And, for those with an ileostomy or colostomy who are concerned about odor when emptying their pouch, there are in-pouch deodorants that can be used to eliminate any waste odors that may exist.   Will you make noises?  Everyone produces gas, especially if they are an air-swallower. But intestinal sounds that occur from time to time are no differ-ent than a gurgling tummy, and quite often your clothing will muffle any sounds.   Will you feel the waste discharges?  For those with a colostomy or ileostomy there might be a slight pressure when waste leaves your body, but understand that the intestines have no nerve endings, so there will be no unpleasant sensations. Those with a urostomy will probably be unaware of any kidney drainage.   Will you be a captive of the toilet?  Immediately post-op you will spend more time in the bathroom than you will after your body recovers from surgery. Every person is different, but on average those with an ileostomy or urostomy may empty their pouches 4 to 6 times a day; a little  less if you have a colostomy. The average wear time between pouch system changes is 3 to 5 days and the changing process should take less than 30 minutes.   Will I need to be on a special diet? Most people return to their normal diet when they have recovered from surgery. Be sure to chew your food well, eat a well-balanced diet and drink plenty of fluids. If   you experience problems with a certain food, wait a couple of weeks and try it again.  Will there be odor and noises? Pouching systems are designed to be odor-proof or odor-resistant. There are deodorants that can be used in the pouch. Medications are also available to help reduce odor. Limit gas-producing foods and carbonated beverages. You will experience less gas and fewer noises as you heal from surgery.  How much time will it take to care for my ostomy? At first, you may  spend a lot of time learning about your ostomy and how to take care of it. As you become more comfortable and skilled at changing the pouching system, it will take very little time to care for it.   Will I be able to return to work? People with ostomies can perform most jobs. As soon as you have healed from surgery, you should be able to return to work. Heavy lifting (more than 10 pounds) may be discouraged.   What about intimacy? Sexual relationships and intimacy are important and fulfilling aspects of your life. They should continue after ostomy surgery. Intimacy-related concerns should be discussed openly between you and your partner.   Can I wear regular clothing? You do not need to wear special clothing. Ostomy pouches are fairly flat and barely noticeable. Elastic undergarments will not hurt the stoma or prevent the ostomy from functioning.   Can I participate in sports? An ostomy should not limit your involvement in sports. Many people with ostomies are runners, skiers, swimmers or participate in other active lifestyles. Talk with your caregiver first before doing heavy physical activity.  Will you starve?  Not if you follow doctor's orders at each stage of your post-op adjustment. There is no such thing as an "ostomy diet". Some people with an ostomy will be able to eat and tolerate anything; others may find diffi-culty with some foods. Each person is an individual and must determine, by trial, what is best for them. A good practice for all is to drink plenty of water.   Will you be a social outcast?  Have you met anyone who has an ostomy and is a social outcast? Why should you be the first? Only your attitude and self image will effect how you are treated. No confi-dent person is an outcast.    PROFESSIONAL HELP   Resources are available if you need help or have questions about your ostomy.   Specially trained nurses called Wound, Ostomy Continence Nurses (WOCN) are available for  consultation in most major medical centers.  Consider getting an ostomy consult at an outpatient ostomy clinic.   [place] has an Ostomy Clinic run by an WOCN ostomy nurse at the [facility].  [phone]. [facility] can help set up an appointment   The [employer] (UOA) is a group made up of many local chapters throughout the [place]. These local groups hold meetings and provide support to prospective and existing ostomates. They sponsor educational events and have qualified visitors to make personal or telephone visits. Contact the UOA for the chapter nearest you and for other educational publications.  More detailed information can be found in Colostomy Guide, a publication of the [employer] (UOA). Contact UOA at 1-800-826-0826 or visit their web site at www.uoaa.org. The website contains links to other sites, suppliers and resources.  [employer] Secure Start Services: Start at the website to enlist for support.  Your Wound Ostomy (WOCN) nurse may have started this   process. https://www.[employer].com/en/securestart Secure Start services are designed to support people as they live their lives with an ostomy or neurogenic bladder. Enrolling is easy and at no cost to the patient. We realize that each person's needs and life journey are different. Through Secure Start services, we want to help people live their life, their way.  #######################################################  

## 2022-10-30 MED ORDER — OXYCODONE HCL 5 MG PO TABS
5.0000 mg | ORAL_TABLET | Freq: Four times a day (QID) | ORAL | 0 refills | Status: DC | PRN
Start: 2022-10-30 — End: 2022-12-27

## 2022-10-30 NOTE — Plan of Care (Signed)

## 2022-10-30 NOTE — Discharge Summary (Signed)
Physician Discharge Summary  Patient ID: Nathan Terrell MRN: [id number] DOB/AGE: Jun 19, 1980 42 y.o.  Admit date: 10/27/2022 Discharge date: 10/30/2022  Admission Diagnoses: Transverse colon polyp  Discharge Diagnoses: Same Principal Problem:   Colon polyp   Discharged Condition: good  Hospital Course: Patient went robotic right colectomy for transverse colon polyp.  He did well postoperatively.  There were some issues with pain on postop day 2 but he is resolved by postop day 3.  His bowel function returned.  He was afebrile with stable vital signs.  His incisions were clean dry intact.  He was tolerating his diet and had bowel function.  He was discharged home on postoperative day 3 in good condition.      Treatments: surgery: Robotic right hemicolectomy  Discharge Exam: Blood pressure (!) 126/92, pulse 70, temperature 98.9 F (37.2 C), temperature source Oral, resp. rate 18, height 5\' 10"  (1.778 m), weight 87.6 kg, SpO2 97%. General appearance: alert and cooperative Resp: clear to auscultation bilaterally Cardio: Normal sinus rhythm Incision/Wound: Incisions clean dry intact.  Abdomen soft appropriately tender nondistended  Disposition: Discharge disposition: 01-Home or Self Care       Discharge Instructions     Diet - low sodium heart healthy   Complete by: As directed    Increase activity slowly   Complete by: As directed       Allergies as of 10/30/2022   No Known Allergies      Medication List     TAKE these medications    multivitamin with minerals tablet Take 1 tablet by mouth daily.   oxyCODONE 5 MG immediate release tablet Commonly known as: Oxy IR/ROXICODONE Take 1 tablet (5 mg total) by mouth every 6 (six) hours as needed for severe pain.        Follow-up Information     Romie Levee, MD. Schedule an appointment as soon as possible for a visit in 2 week(s).   Specialties: General Surgery, Colon and Rectal Surgery Contact  information: [address] [place] [address] [place] [address] [phone]                 Signed: Clovis Pu Laderius Valbuena MD 10/30/2022, 11:03 AM

## 2022-10-30 NOTE — Progress Notes (Signed)
Reviewed written discharge instructions with patient. All questions answered. Patient verbalized understanding. Discharged, walking w/nurse tech, with all belongings, in stable condition.

## 2022-11-02 LAB — SURGICAL PATHOLOGY

## 2022-11-10 ENCOUNTER — Other Ambulatory Visit: Payer: Self-pay

## 2022-11-10 NOTE — Progress Notes (Signed)
The proposed treatment discussed in conference is for discussion purpose only and is not a binding recommendation.  The patients have not been physically examined, or presented with their treatment options.  Therefore, final treatment plans cannot be decided.  

## 2022-11-19 ENCOUNTER — Emergency Department (HOSPITAL_COMMUNITY): Payer: BC Managed Care – PPO

## 2022-11-19 ENCOUNTER — Encounter (HOSPITAL_COMMUNITY): Payer: Self-pay

## 2022-11-19 ENCOUNTER — Other Ambulatory Visit: Payer: Self-pay

## 2022-11-19 ENCOUNTER — Observation Stay (HOSPITAL_COMMUNITY)
Admission: EM | Admit: 2022-11-19 | Discharge: 2022-11-20 | Disposition: A | Discharge status: Home / Self Care | Payer: BC Managed Care – PPO | Attending: Family Medicine | Admitting: Family Medicine

## 2022-11-19 DIAGNOSIS — C189 Malignant neoplasm of colon, unspecified: Secondary | ICD-10-CM

## 2022-11-19 DIAGNOSIS — K55069 Acute infarction of intestine, part and extent unspecified: Principal | ICD-10-CM | POA: Insufficient documentation

## 2022-11-19 DIAGNOSIS — Z85038 Personal history of other malignant neoplasm of large intestine: Secondary | ICD-10-CM | POA: Diagnosis not present

## 2022-11-19 DIAGNOSIS — Z9049 Acquired absence of other specified parts of digestive tract: Secondary | ICD-10-CM | POA: Diagnosis not present

## 2022-11-19 DIAGNOSIS — R109 Unspecified abdominal pain: Secondary | ICD-10-CM | POA: Diagnosis present

## 2022-11-19 LAB — CBC WITH DIFFERENTIAL/PLATELET
Abs Immature Granulocytes: 0.02 10*3/uL (ref 0.00–0.07)
Basophils Absolute: 0 10*3/uL (ref 0.0–0.1)
Basophils Relative: 0 %
Eosinophils Absolute: 0.1 10*3/uL (ref 0.0–0.5)
Eosinophils Relative: 1 %
HCT: 40.8 % (ref 39.0–52.0)
Hemoglobin: 13.5 g/dL (ref 13.0–17.0)
Immature Granulocytes: 0 %
Lymphocytes Relative: 27 %
Lymphs Abs: 2 10*3/uL (ref 0.7–4.0)
MCH: 29.5 pg (ref 26.0–34.0)
MCHC: 33.1 g/dL (ref 30.0–36.0)
MCV: 89.1 fL (ref 80.0–100.0)
Monocytes Absolute: 0.7 10*3/uL (ref 0.1–1.0)
Monocytes Relative: 9 %
Neutro Abs: 4.8 10*3/uL (ref 1.7–7.7)
Neutrophils Relative %: 63 %
Platelets: 324 10*3/uL (ref 150–400)
RBC: 4.58 MIL/uL (ref 4.22–5.81)
RDW: 12.2 % (ref 11.5–15.5)
WBC: 7.7 10*3/uL (ref 4.0–10.5)
nRBC: 0 % (ref 0.0–0.2)

## 2022-11-19 LAB — COMPREHENSIVE METABOLIC PANEL
ALT: 27 U/L (ref 0–44)
AST: 23 U/L (ref 15–41)
Albumin: 3.7 g/dL (ref 3.5–5.0)
Alkaline Phosphatase: 68 U/L (ref 38–126)
Anion gap: 9 (ref 5–15)
BUN: 18 mg/dL (ref 6–20)
CO2: 27 mmol/L (ref 22–32)
Calcium: 8.8 mg/dL — ABNORMAL LOW (ref 8.9–10.3)
Chloride: 100 mmol/L (ref 98–111)
Creatinine, Ser: 1.18 mg/dL (ref 0.61–1.24)
GFR, Estimated: >60 mL/min (ref >60)
Glucose, Bld: 95 mg/dL (ref 70–99)
Potassium: 4 mmol/L (ref 3.5–5.1)
Sodium: 136 mmol/L (ref 135–145)
Total Bilirubin: 0.3 mg/dL (ref 0.3–1.2)
Total Protein: 7.1 g/dL (ref 6.5–8.1)

## 2022-11-19 LAB — LACTIC ACID, PLASMA
Lactic Acid, Venous: 1.1 mmol/L (ref 0.5–1.9)
Lactic Acid, Venous: 0.4 mmol/L — ABNORMAL LOW (ref 0.5–1.9)

## 2022-11-19 MED ORDER — MORPHINE SULFATE (PF) 4 MG/ML IV SOLN
4.0000 mg | Freq: Once | INTRAVENOUS | Status: AC
  Administered 2022-11-19: 4 mg via INTRAVENOUS
  Filled 2022-11-19: qty 1

## 2022-11-19 MED ORDER — ONDANSETRON HCL 4 MG/2ML IJ SOLN: 4.0000 mg | Freq: Four times a day (QID) | INTRAMUSCULAR | Status: DC | PRN

## 2022-11-19 MED ORDER — HEPARIN BOLUS VIA INFUSION
3000.0000 [IU] | Freq: Once | INTRAVENOUS | Status: AC
  Administered 2022-11-19: 3000 [IU] via INTRAVENOUS
  Filled 2022-11-19: qty 3000

## 2022-11-19 MED ORDER — ONDANSETRON HCL 4 MG PO TABS: 4.0000 mg | ORAL_TABLET | Freq: Four times a day (QID) | ORAL | Status: DC | PRN

## 2022-11-19 MED ORDER — IOHEXOL 350 MG/ML SOLN
100.0000 mL | Freq: Once | INTRAVENOUS | Status: AC | PRN
  Administered 2022-11-19: 100 mL via INTRAVENOUS

## 2022-11-19 MED ORDER — MORPHINE SULFATE (PF) 2 MG/ML IV SOLN
2.0000 mg | INTRAVENOUS | Status: DC | PRN
  Administered 2022-11-19: 2 mg via INTRAVENOUS
  Filled 2022-11-19: qty 1

## 2022-11-19 MED ORDER — SODIUM CHLORIDE 0.9 % IV BOLUS
1000.0000 mL | Freq: Once | INTRAVENOUS | Status: AC
  Administered 2022-11-19: 1000 mL via INTRAVENOUS

## 2022-11-19 MED ORDER — HEPARIN (PORCINE) 25000 UT/250ML-% IV SOLN
1700.0000 [IU]/h | INTRAVENOUS | Status: DC
  Administered 2022-11-19: 1550 [IU]/h via INTRAVENOUS
  Administered 2022-11-20: 1700 [IU]/h via INTRAVENOUS
  Filled 2022-11-19 (×2): qty 250

## 2022-11-19 MED ORDER — ACETAMINOPHEN 325 MG PO TABS
650.0000 mg | ORAL_TABLET | Freq: Four times a day (QID) | ORAL | Status: DC | PRN
  Administered 2022-11-20: 650 mg via ORAL
  Filled 2022-11-19: qty 2

## 2022-11-19 MED ORDER — ONDANSETRON HCL 4 MG/2ML IJ SOLN
4.0000 mg | Freq: Once | INTRAMUSCULAR | Status: AC
  Administered 2022-11-19: 4 mg via INTRAVENOUS
  Filled 2022-11-19: qty 2

## 2022-11-19 MED ORDER — OXYCODONE HCL 5 MG PO TABS
5.0000 mg | ORAL_TABLET | ORAL | Status: DC | PRN
  Administered 2022-11-20: 5 mg via ORAL
  Filled 2022-11-19: qty 1

## 2022-11-19 MED ORDER — FAMOTIDINE 20 MG PO TABS: 40.0000 mg | ORAL_TABLET | Freq: Every day | ORAL | Status: DC

## 2022-11-19 MED ORDER — ACETAMINOPHEN 650 MG RE SUPP: 650.0000 mg | Freq: Four times a day (QID) | RECTAL | Status: DC | PRN

## 2022-11-19 NOTE — ED Triage Notes (Signed)
Pt had a surgery for colon resection. Pt expericing back and abd pain, PCP sent pt to have a CT scan done. Blood clot found in his abs, and was referred to the ER. Denies any SOB.

## 2022-11-19 NOTE — H&P (Addendum)
History and Physical    Patient: Nathan Terrell ZOX:[id number] DOB: [birth year] DOA: 11/19/2022 DOS: the patient was seen and examined on 11/19/2022 PCP: Medicine, [facility]  Patient coming from: Home - lives with wife and kids    Chief Complaint: back and abdominal pain/abnormal CT findings   HPI: Nathan Terrell is a 42 y.o. male with medical history significant of hx of SBO who presented to ED with abdomen and back pain for a week. He thought he had kidney stones, but he couldn't stretch or reposition to make it better. He states pain last night and today was 6-8/10. Pain located mid thoracic area, with occasional radiation to his stomach. Pain dull.  Today pain radiated more toward his stomach. He has had no fever/chills. No N/V, but has had some diarrhea throughout the week. He went and saw his PCP today.  He had an outpatient CT that suggested mesenteric venous thrombosis. He was sent to ED today.   He was admitted in march of 2025 for intussusception of the colon and underwent a colonoscopy and was found to have a large polyp which was mostly removed. 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 Genetic referral was made. He then had surgical resection of the polyp by general surgery in July with robotic  assisted partial colectomy on 10/27/22 by Dr. Maisie Fus. 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 Margins negative. Lymph nodes negative. Has f/u with oncology next week.    Denies any fever/chills, vision changes/headaches, chest pain or palpitations, shortness of breath or cough,   N/V, dysuria or leg swelling.   He does not smoke. He drinks alcohol about 3-4 nights/week   ER Course:  vitals: afebrile, bp: 131/79, HR: 63, RR: 15, oxygen: 97%RA Pertinent labs: none  CTA: Acute thrombosis of the superior mesenteric vein as well as multiple more peripheral mesenteric venules with  collateralization to the inferior mesenteric vein which remains patent. Thrombus minimally extends into the portosplenic confluence. Thrombosis of the peripheral branches of the right intrahepatic portal venous system. No evidence of frank bowel ischemia. 2. 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 Robust collateralization by the superior mesenteric artery. 3. Mild colonic diverticulosis. 4. Interval right hemicolectomy. In ED: general surgery consulted, started on heparin gtt, 1L IVF and zofran given and TRH asked to admit.    Review of Systems: As mentioned in the history of present illness. All other systems reviewed and are negative. Past Medical History:  Diagnosis Date   Adenomatous polyps    Cough 03/12/2013   DJD (degenerative joint disease) 02/17/2013   right shoulder (acromial)   Family history of polyps in the colon    SLAP lesion, type II 02/17/2013   right   Past Surgical History:  Procedure Laterality Date   COLONOSCOPY WITH PROPOFOL N/A 06/30/2022   Procedure: COLONOSCOPY WITH PROPOFOL;  Surgeon: Jeani Hawking, MD;  Location: [facility];  Service: Gastroenterology;  Laterality: N/A;   HEMOSTASIS CLIP PLACEMENT  06/30/2022   Procedure: HEMOSTASIS CLIP PLACEMENT;  Surgeon: Jeani Hawking, MD;  Location: [facility];  Service: Gastroenterology;;   NO PAST SURGERIES     POLYPECTOMY  06/30/2022   Procedure: POLYPECTOMY;  Surgeon: Jeani Hawking, MD;  Location: [facility];  Service: Gastroenterology;;   [name]  06/30/2022   Procedure: [name];  Surgeon: Jeani Hawking, MD;  Location: [facility];  Service: Gastroenterology;;   SHOULDER ARTHROSCOPY WITH SUBACROMIAL  DECOMPRESSION, ROTATOR CUFF REPAIR AND BICEP TENDON REPAIR Right 03/20/2013   Procedure: RIGHT SHOULDER ARTHROSCOPY WITH SUBACROMIAL DECOMPRESSION, DISTAL CLAVICLE RESECTION, OPEN BICEPS TENODESIS;  Surgeon: Wyn Forster.,  MD;  Location: [facility];  Service: Orthopedics;  Laterality: Right;   SUBMUCOSAL TATTOO INJECTION  06/30/2022   Procedure: SUBMUCOSAL TATTOO INJECTION;  Surgeon: Jeani Hawking, MD;  Location: [facility];  Service: Gastroenterology;;   Social History:  reports that he has never smoked. He has never used smokeless tobacco. He reports current alcohol use. He reports current drug use. Drug: Marijuana.  No Known Allergies  Family History  Problem Relation Age of Onset   Colon polyps Father    Colon polyps Maternal Aunt    Colon polyps Maternal Grandfather    Clotting disorder Maternal Grandfather    Other Paternal Grandmother        'tumor' in heart   Dementia Paternal Grandfather    Colon polyps Paternal Grandfather    Colon cancer Other        MGF's father   Stomach cancer Paternal Great-grandmother        PGM's mother   Colon cancer Maternal Great-grandfather        MGM's father   Brain cancer Maternal Great-grandfather        MGMs father    Prior to Admission medications   Medication Sig Start Date End Date Taking? Authorizing Provider  Multiple Vitamins-Minerals (MULTIVITAMIN WITH MINERALS) tablet Take 1 tablet by mouth daily with breakfast.   Yes [provider]  TYLENOL 500 MG tablet Take 500-1,000 mg by mouth every 6 (six) hours as needed for mild pain or headache.   Yes [provider]  oxyCODONE (OXY IR/ROXICODONE) 5 MG immediate release tablet Take 1 tablet (5 mg total) by mouth every 6 (six) hours as needed for severe pain. Patient not taking: Reported on 11/19/2022 10/30/22   Harriette Bouillon, MD    Physical Exam: Vitals:   11/19/22 1635 11/19/22 1929  BP: (!) 132/93 131/79  Pulse: 84 63  Resp: 18 15  Temp: 98.5 F (36.9 C) 98.6 F (37 C)  TempSrc: Oral   SpO2: 100% 97%  Weight: 88.5 kg   Height: 5\' 10"  (1.778 m)    General:  Appears calm and comfortable and is in NAD Eyes:  PERRL, EOMI, normal lids, iris ENT:  grossly normal  hearing, lips & tongue, mmm; appropriate dentition Neck:  no LAD, masses or thyromegaly; no carotid bruits Cardiovascular:  RRR, no m/r/g. No LE edema.  Respiratory:   CTA bilaterally with no wheezes/rales/rhonchi.  Normal respiratory effort. Abdomen:  soft, NT, ND, NABS Back:   normal alignment, no CVAT Skin:  no rash or induration seen on limited exam Musculoskeletal:  grossly normal tone BUE/BLE, good ROM, no bony abnormality Lower extremity:  No LE edema.  Limited foot exam with no ulcerations.  2+ distal pulses. Psychiatric:  grossly normal mood and affect, speech fluent and appropriate, AOx3 Neurologic:  CN 2-12 grossly intact, moves all extremities in coordinated fashion, sensation intact   Radiological Exams on Admission: Independently reviewed - see discussion in A/P where applicable  CT Angio Abd/Pel W and/or Wo Contrast  Result Date: 11/19/2022 CLINICAL DATA:  Acute mesenteric ischemia, abdominal and back pain, status post right hemicolectomy, colon cancer. EXAM: CTA ABDOMEN AND PELVIS WITHOUT AND WITH CONTRAST TECHNIQUE: Multidetector CT imaging of the abdomen and pelvis was performed using the standard protocol during bolus administration of intravenous contrast. Multiplanar reconstructed images  and MIPs were obtained and reviewed to evaluate the vascular anatomy. RADIATION DOSE REDUCTION: This exam was performed according to the departmental dose-optimization program which includes automated exposure control, adjustment of the mA and/or kV according to patient size and/or use of iterative reconstruction technique. CONTRAST:  OMNIPAQUE IOHEXOL 350 MG/ML SOLN COMPARISON:  06/28/2022 FINDINGS: VASCULAR Aorta: Normal caliber aorta without aneurysm, dissection, vasculitis or significant stenosis. 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 Distally widely patent without evidence of aneurysm or dissection.  SMA: Patent without evidence of aneurysm, dissection, vasculitis or significant stenosis. Collateralization to the celiac distribution via pancreaticoduodenal and gastroduodenal a arteries. Renals: Dual renal arteries bilaterally are widely patent and demonstrate normal vascular morphology. No aneurysm or dissection. IMA: Patent without evidence of aneurysm, dissection, vasculitis or significant stenosis. Inflow: Patent without evidence of aneurysm, dissection, vasculitis or significant stenosis. Proximal Outflow: Bilateral common femoral and visualized portions of the superficial and profunda femoral arteries are patent without evidence of aneurysm, dissection, vasculitis or significant stenosis. Veins: There is acute thrombosis of the superior mesenteric vein as well as multiple more peripheral mesenteric venules with collateralization to the inferior mesenteric vein which remains patent. The splenic vein and main portal vein are patent though thrombus minimally extends into the a portosplenic confluence, best appreciated on coronal image # 68/15. There is peripheral thrombosis of the right posterior portal venous branches best appreciated on image # 26/19. Review of the MIP images confirms the above findings. NON-VASCULAR Lower chest: No acute abnormality. Hepatobiliary: No enhancing intrahepatic mass identified. No intra or extrahepatic biliary ductal dilation. Gallbladder unremarkable. Pancreas: Unremarkable. No pancreatic ductal dilatation or surrounding inflammatory changes. Spleen: Normal in size without focal abnormality. Adrenals/Urinary Tract: Adrenal glands are unremarkable. Kidneys are normal, without renal calculi, focal lesion, or hydronephrosis. Bladder is unremarkable. Stomach/Bowel: Mild colonic diverticulosis. Surgical changes of right hemicolectomy are identified in the interval since prior examination. Mild mesenteric edema surrounding the acutely thrombosed segments of the mesenteric venous  system. There is, however, normal bowel wall enhancement. No pneumatosis or free intraperitoneal gas. No free intraperitoneal fluid. No evidence of obstruction. Lymphatic: No pathologic adenopathy within the abdomen and pelvis. Reproductive: Prostate is unremarkable. Other: No abdominal wall hernia Musculoskeletal: No acute bone abnormality. No lytic or blastic bone lesion. IMPRESSION: 1. Acute thrombosis of the superior mesenteric vein as well as multiple more peripheral mesenteric venules with collateralization to the inferior mesenteric vein which remains patent. Thrombus minimally extends into the portosplenic confluence. Thrombosis of the peripheral branches of the right intrahepatic portal venous system. No evidence of frank bowel ischemia. 2. 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 Robust collateralization by the superior mesenteric artery. 3. Mild colonic diverticulosis. 4. Interval right hemicolectomy. These results were called by telephone at the time of interpretation on 11/19/2022 at 8:12 pm to provider DAVID YAO , who verbally acknowledged these results. Electronically Signed   By: Helyn Numbers M.D.   On: 11/19/2022 20:13    EKG: Independently reviewed.  NSR with rate 80; nonspecific ST changes with no evidence of acute ischemia. No previous EKG    Labs on Admission: I have personally reviewed the available labs and imaging studies at the time of the admission.  Pertinent labs:   none  Assessment and Plan: Principal Problem:   Superior mesenteric vein thrombosis (HCC) Active Problems:   Colon cancer (HCC)  Assessment and Plan: * Superior mesenteric vein thrombosis (HCC) 42 year old male presenting with one week history of worsening thoracic back pain with radiation to abdomen who is s/p robotic colectomy on 10/27/22 for adenocarcinoma found to have acute thrombosis of superior mesenteric vein. Also has thrombosis  of the peripheral branches of the right intrahepatic portal venous system.  system -obs to med-surg -lactic acid wnl with no evidence of frank bowel ischemia. No other sepsis criteria.  -general surgery consulted, will follow -heparin gtt started -will need transition to DOAC, SW consulted -has oncology f/u next week. He is 3 weeks post op and very active with stage 1 CA. 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 Robust collateralization by superior mesenteric artery. F/u on surgery recommendations/any future issues?  -still with intermittent pain: percocet and morphine for breakthrough pain    Colon cancer [facility]) S/p robotic assisted colectomy on 10/27/22 by Dr. Maisie Fus for large polyp resection found to have surgical pathology revealing invasive moderately differentiated colonic adenocarcinoma. Margins negative. Lymph nodes negative x 27 lymph nodes Has appointment with oncology next week. Already has met with genetic counselor     Advance Care Planning:   Code Status: Full Code   Consults: general surgery: Dr. Luisa Hart   DVT Prophylaxis: heparin gtt   Family Communication: none   Severity of Illness: The appropriate patient status for this patient is OBSERVATION. Observation status is judged to be reasonable and necessary in order to provide the required intensity of service to ensure the patient's safety. The patient's presenting symptoms, physical exam findings, and initial radiographic and laboratory data in the context of their medical condition is felt to place them at decreased risk for further clinical deterioration. Furthermore, it is anticipated that the patient will be medically stable for discharge from the hospital within 2 midnights of admission.   Author: Orland Mustard, MD 11/19/2022 10:38 PM  For on call review [email or web address].

## 2022-11-19 NOTE — ED Notes (Signed)
Pt's urine was collected and sent to the lab.

## 2022-11-19 NOTE — ED Provider Notes (Signed)
[place] EMERGENCY DEPARTMENT AT [facility] Provider Note   CSN: [id number] Arrival date & time: 11/19/22  1620     History  Chief Complaint  Patient presents with   Abdominal Pain    Nathan Terrell is a 42 y.o. male history of colon cancer here presenting with abnormal CT scan finding.  Patient had colon resection done about 3 weeks ago by Dr. Maisie Fus.  Patient states that he has some back pain for the last 2 to 3 days.  Patient denies any diarrhea or vomiting.  Patient went to see his PCP and a CT scan that showed possible mesenteric ischemia.  Patient denies any fevers.  The history is provided by the patient.       Home Medications Prior to Admission medications   Medication Sig Start Date End Date Taking? Authorizing Provider  Multiple Vitamins-Minerals (MULTIVITAMIN WITH MINERALS) tablet Take 1 tablet by mouth daily.    [provider]  oxyCODONE (OXY IR/ROXICODONE) 5 MG immediate release tablet Take 1 tablet (5 mg total) by mouth every 6 (six) hours as needed for severe pain. 10/30/22   Harriette Bouillon, MD      Allergies    Patient has no known allergies.    Review of Systems   Review of Systems  Gastrointestinal:  Positive for abdominal pain.  All other systems reviewed and are negative.   Physical Exam Updated Vital Signs BP (!) 132/93 (BP Location: Right Arm)   Pulse 84   Temp 98.5 F (36.9 C) (Oral)   Resp 18   Ht 5\' 10"  (1.778 m)   Wt 88.5 kg   SpO2 100%   BMI 27.98 kg/m  Physical Exam Vitals and nursing note reviewed.  Constitutional:      Appearance: He is well-developed.  HENT:     Head: Normocephalic.     Mouth/Throat:     Mouth: Mucous membranes are moist.  Eyes:     Extraocular Movements: Extraocular movements intact.     Pupils: Pupils are equal, round, and reactive to light.  Cardiovascular:     Rate and Rhythm: Normal rate and regular rhythm.  Pulmonary:     Effort: Pulmonary effort is normal.     Breath  sounds: Normal breath sounds.  Abdominal:     General: Abdomen is flat.     Comments: Surgical scars are healing well.  Patient has nontender abdominal exam.  Skin:    General: Skin is warm.     Capillary Refill: Capillary refill takes less than 2 seconds.  Neurological:     General: No focal deficit present.     Mental Status: He is alert.  Psychiatric:        Mood and Affect: Mood normal.        Behavior: Behavior normal.     ED Results / Procedures / Treatments   Labs (all labs ordered are listed, but only abnormal results are displayed) Labs Reviewed  COMPREHENSIVE METABOLIC PANEL - Abnormal; Notable for the following components:      Result Value   Calcium 8.8 (*)    All other components within normal limits  CBC WITH DIFFERENTIAL/PLATELET  LACTIC ACID, PLASMA  LACTIC ACID, PLASMA    EKG EKG Interpretation Date/Time:  Friday November 19 2022 16:44:00 EDT Ventricular Rate:  80 PR Interval:  130 QRS Duration:  93 QT Interval:  391 QTC Calculation: 451 R Axis:   85  Text Interpretation: Sinus rhythm Biatrial enlargement Abnrm T, probable ischemia,  anterolateral lds Borderline ST elevation, anterior leads No previous ECGs available Confirmed by Richardean Canal ([id number]) on 11/19/2022 6:34:36 PM  Radiology CT Angio Abd/Pel W and/or Wo Contrast  Result Date: 11/19/2022 CLINICAL DATA:  Acute mesenteric ischemia, abdominal and back pain, status post right hemicolectomy, colon cancer. EXAM: CTA ABDOMEN AND PELVIS WITHOUT AND WITH CONTRAST TECHNIQUE: Multidetector CT imaging of the abdomen and pelvis was performed using the standard protocol during bolus administration of intravenous contrast. Multiplanar reconstructed images and MIPs were obtained and reviewed to evaluate the vascular anatomy. RADIATION DOSE REDUCTION: This exam was performed according to the departmental dose-optimization program which includes automated exposure control, adjustment of the mA and/or kV according to  patient size and/or use of iterative reconstruction technique. CONTRAST:  OMNIPAQUE IOHEXOL 350 MG/ML SOLN COMPARISON:  06/28/2022 FINDINGS: VASCULAR Aorta: Normal caliber aorta without aneurysm, dissection, vasculitis or significant stenosis. 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 Distally widely patent without evidence of aneurysm or dissection. SMA: Patent without evidence of aneurysm, dissection, vasculitis or significant stenosis. Collateralization to the celiac distribution via pancreaticoduodenal and gastroduodenal a arteries. Renals: Dual renal arteries bilaterally are widely patent and demonstrate normal vascular morphology. No aneurysm or dissection. IMA: Patent without evidence of aneurysm, dissection, vasculitis or significant stenosis. Inflow: Patent without evidence of aneurysm, dissection, vasculitis or significant stenosis. Proximal Outflow: Bilateral common femoral and visualized portions of the superficial and profunda femoral arteries are patent without evidence of aneurysm, dissection, vasculitis or significant stenosis. Veins: There is acute thrombosis of the superior mesenteric vein as well as multiple more peripheral mesenteric venules with collateralization to the inferior mesenteric vein which remains patent. The splenic vein and main portal vein are patent though thrombus minimally extends into the a portosplenic confluence, best appreciated on coronal image # 68/15. There is peripheral thrombosis of the right posterior portal venous branches best appreciated on image # 26/19. Review of the MIP images confirms the above findings. NON-VASCULAR Lower chest: No acute abnormality. Hepatobiliary: No enhancing intrahepatic mass identified. No intra or extrahepatic biliary ductal dilation. Gallbladder unremarkable. Pancreas: Unremarkable. No pancreatic ductal dilatation or surrounding inflammatory changes.  Spleen: Normal in size without focal abnormality. Adrenals/Urinary Tract: Adrenal glands are unremarkable. Kidneys are normal, without renal calculi, focal lesion, or hydronephrosis. Bladder is unremarkable. Stomach/Bowel: Mild colonic diverticulosis. Surgical changes of right hemicolectomy are identified in the interval since prior examination. Mild mesenteric edema surrounding the acutely thrombosed segments of the mesenteric venous system. There is, however, normal bowel wall enhancement. No pneumatosis or free intraperitoneal gas. No free intraperitoneal fluid. No evidence of obstruction. Lymphatic: No pathologic adenopathy within the abdomen and pelvis. Reproductive: Prostate is unremarkable. Other: No abdominal wall hernia Musculoskeletal: No acute bone abnormality. No lytic or blastic bone lesion. IMPRESSION: 1. Acute thrombosis of the superior mesenteric vein as well as multiple more peripheral mesenteric venules with collateralization to the inferior mesenteric vein which remains patent. Thrombus minimally extends into the portosplenic confluence. Thrombosis of the peripheral branches of the right intrahepatic portal venous system. No evidence of frank bowel ischemia. 2. 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 Robust collateralization by the superior mesenteric artery. 3. Mild colonic diverticulosis. 4. Interval right hemicolectomy. These results were called by telephone at the time of interpretation on 11/19/2022 at 8:12 pm to provider   , who verbally acknowledged these results. Electronically Signed  By: Helyn Numbers M.D.   On: 11/19/2022 20:13    Procedures Procedures    CRITICAL CARE Performed by: Richardean Canal   Total critical care time: 34 minutes  Critical care time was exclusive of separately billable procedures and treating other patients.  Critical care was necessary to treat or prevent imminent or  life-threatening deterioration.  Critical care was time spent personally by me on the following activities: development of treatment plan with patient and/or surrogate as well as nursing, discussions with consultants, evaluation of patient's response to treatment, examination of patient, obtaining history from patient or surrogate, ordering and performing treatments and interventions, ordering and review of laboratory studies, ordering and review of radiographic studies, pulse oximetry and re-evaluation of patient's condition.   Medications Ordered in ED Medications  heparin ADULT infusion 100 units/mL (25000 units/249mL) (has no administration in time range)  heparin bolus via infusion 3,000 Units (has no administration in time range)  sodium chloride 0.9 % bolus 1,000 mL (1,000 mLs Intravenous New Bag/Given 11/19/22 1815)  morphine (PF) 4 MG/ML injection 4 mg (4 mg Intravenous Given 11/19/22 1815)  ondansetron (ZOFRAN) injection 4 mg (4 mg Intravenous Given 11/19/22 1814)  iohexol (OMNIPAQUE) 350 MG/ML injection 100 mL (100 mLs Intravenous Contrast Given 11/19/22 1942)    ED Course/ Medical Decision Making/ A&P                                 Medical Decision Making SANTINO KINSELLA is a 42 y.o. male here presenting with abdominal pain and possible mesenteric ischemia.  I reviewed the images from outpatient CT scan.  It shows some dilated mesenteric veins.  I discussed case with Dr. Luisa Hart from general surgery.  He states that patient will need a CTA to make sure patient has no mesenteric ischemia.  If he does have mesenteric ischemia, patient will likely need to be anticoagulated.  Will get CBC and CMP and lactate and CTA.  8:29 PM CT showed acute thrombosis of the superior mesenteric vein.  Patient has no obvious bowel ischemia.  I discussed case with Dr. Luisa Hart again.  He recommend IV blood thinners.  He states that surgery will round on the patient.  He recommend medicine  admission.   Problems Addressed: Superior mesenteric vein thrombosis (HCC): acute illness or injury  Amount and/or Complexity of Data Reviewed Labs: ordered. Decision-making details documented in ED Course. Radiology: ordered and independent interpretation performed. Decision-making details documented in ED Course.  Risk Prescription drug management. Decision regarding hospitalization.    Final Clinical Impression(s) / ED Diagnoses Final diagnoses:  None    Rx / DC Orders ED Discharge Orders     None         Charlynne Pander, MD 11/19/22 2030

## 2022-11-19 NOTE — Assessment & Plan Note (Addendum)
S/p robotic assisted colectomy on 10/27/22 by Dr. Maisie Fus for large polyp resection found to have surgical pathology revealing invasive moderately differentiated colonic adenocarcinoma. Margins negative. Lymph nodes negative x 27 lymph nodes Has appointment with oncology next week. Already has met with genetic counselor

## 2022-11-19 NOTE — Progress Notes (Signed)
ANTICOAGULATION CONSULT NOTE - Initial Consult  Pharmacy Consult for heparin  Indication: superior mesenteric vein occlusion   No Known Allergies  Patient Measurements: Height: 5\' 10"  (177.8 cm) Weight: 88.5 kg (195 lb) IBW/kg (Calculated) : 73 Heparin Dosing Weight: 88.5 kg  Vital Signs: Temp: 98.5 F (36.9 C) (08/02 1635) Temp Source: Oral (08/02 1635) BP: 132/93 (08/02 1635) Pulse Rate: 84 (08/02 1635)  Labs: Recent Labs    11/19/22 1816  HGB 13.5  HCT 40.8  PLT 324  CREATININE 1.18    Estimated Creatinine Clearance: 90.4 mL/min (by C-G formula based on SCr of 1.18 mg/dL).   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  No anticoagulant PTA.  CBC WNL SCr 1.18  Goal of Therapy:  Heparin level 0.3-0.7 units/ml Monitor platelets by anticoagulation protocol: Yes   Plan:  Give 3000 units bolus x 1 Start heparin infusion at 1550 units/hr Check anti-Xa level in 6 hours and daily while on heparin Continue to monitor H&H and platelets   Herby Abraham, Pharm.D Use secure chat for questions 11/19/2022 8:18 PM

## 2022-11-19 NOTE — Assessment & Plan Note (Addendum)
42 year old male presenting with one week history of worsening thoracic back pain with radiation to abdomen who is s/p robotic colectomy on 10/27/22 for adenocarcinoma found to have acute thrombosis of superior mesenteric vein. 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  system -obs to med-surg -lactic acid wnl with no evidence of frank bowel ischemia. No other sepsis criteria.  -general surgery consulted, will follow -heparin gtt started -will need transition to DOAC, SW consulted -has oncology f/u next week. He is 3 weeks post op and very active with stage 1 CA. 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 Robust collateralization by superior mesenteric artery. F/u on surgery recommendations/any future issues?  -still with intermittent pain: percocet and morphine for breakthrough pain

## 2022-11-20 ENCOUNTER — Other Ambulatory Visit (HOSPITAL_COMMUNITY): Payer: Self-pay

## 2022-11-20 DIAGNOSIS — K55069 Acute infarction of intestine, part and extent unspecified: Secondary | ICD-10-CM | POA: Diagnosis not present

## 2022-11-20 LAB — HEPARIN LEVEL (UNFRACTIONATED): Heparin Unfractionated: 0.44 IU/mL (ref 0.30–0.70)

## 2022-11-20 MED ORDER — RIVAROXABAN 15 MG PO TABS
15.0000 mg | ORAL_TABLET | Freq: Once | ORAL | Status: AC
  Administered 2022-11-20: 15 mg via ORAL
  Filled 2022-11-20: qty 1

## 2022-11-20 MED ORDER — RIVAROXABAN (XARELTO) VTE STARTER PACK (15 & 20 MG): ORAL_TABLET | ORAL | 0 refills | Status: DC

## 2022-11-20 MED ORDER — RIVAROXABAN 10 MG PO TABS: 20.0000 mg | ORAL_TABLET | Freq: Every day | ORAL | Status: DC

## 2022-11-20 MED ORDER — RIVAROXABAN 20 MG PO TABS: 20.0000 mg | ORAL_TABLET | Freq: Every day | ORAL | 0 refills | Status: DC

## 2022-11-20 MED ORDER — APIXABAN 5 MG PO TABS: 5.0000 mg | ORAL_TABLET | Freq: Two times a day (BID) | ORAL | Status: DC

## 2022-11-20 MED ORDER — RIVAROXABAN 15 MG PO TABS
15.0000 mg | ORAL_TABLET | Freq: Two times a day (BID) | ORAL | Status: DC
  Filled 2022-11-20: qty 1

## 2022-11-20 MED ORDER — APIXABAN 5 MG PO TABS: 10.0000 mg | ORAL_TABLET | Freq: Two times a day (BID) | ORAL | Status: DC

## 2022-11-20 NOTE — Progress Notes (Addendum)
ANTICOAGULATION CONSULT NOTE - Follow-Up Consult  Pharmacy Consult for heparin >> Xarelto Indication: superior mesenteric vein occlusion   No Known Allergies  Patient Measurements: Height: 5\' 10"  (177.8 cm) Weight: 88.5 kg (195 lb) IBW/kg (Calculated) : 73 Heparin Dosing Weight: 88.5 kg  Vital Signs: Temp: 98.4 F (36.9 C) (08/03 0452) Temp Source: Oral (08/03 0452) BP: 109/69 (08/03 0452) Pulse Rate: 49 (08/03 0452)  Labs: Recent Labs    11/19/22 1816 11/20/22 0254 11/20/22 1106  HGB 13.5 11.5*  --   HCT 40.8 36.0*  --   PLT 324 252  --   HEPARINUNFRC  --  0.25* 0.44  CREATININE 1.18  --   --     Estimated Creatinine Clearance: 90.4 mL/min (by C-G formula based on SCr of 1.18 mg/dL).  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 No anticoagulant PTA.   11/20/22 Heparin level now therapeutic on 1700 units/hr Hgb down 2g from admission but remains >10; Plt stable WNL No bleeding or infusion issues per RN SCr WNL Per MD, will transition to Xarelto today  Goal of Therapy:  Heparin level 0.3-0.7 units/ml Monitor platelets by anticoagulation protocol: Yes   Plan:  Start Xarelto 15 mg PO bid x 21 days, followed by 20 mg PO daily thereafter Stop heparin infusion with first dose of Xarelto Pharmacy to provide Xarelto education & coupons prior to discharge   Bernadene Person, PharmD, BCPS [phone] 11/20/2022, 11:59 AM

## 2022-11-20 NOTE — Discharge Instructions (Signed)
Information on my medicine - XARELTO (rivaroxaban)  This medication education was reviewed with me or my healthcare representative as part of my discharge preparation.  The pharmacist that spoke with me during my hospital stay was:  ,  A, RPH  WHY WAS XARELTO PRESCRIBED FOR YOU? Xarelto was prescribed to treat blood clots that may have been found in the veins of your legs (deep vein thrombosis) or in your lungs (pulmonary embolism) and to reduce the risk of them occurring again.  What do you need to know about Xarelto? The starting dose is one 15 mg tablet taken TWICE daily with food for the FIRST 21 DAYS then on or about 12/11/2022, the dose is changed to one 20 mg tablet taken ONCE A DAY with your evening meal.  DO NOT stop taking Xarelto without talking to the health care provider who prescribed the medication.  Refill your prescription for 20 mg tablets before you run out.  After discharge, you should have regular check-up appointments with your healthcare provider that is prescribing your Xarelto.  In the future your dose may need to be changed if your kidney function changes by a significant amount.  What do you do if you miss a dose? If you are taking Xarelto TWICE DAILY and you miss a dose, take it as soon as you remember. You may take two 15 mg tablets (total 30 mg) at the same time then resume your regularly scheduled 15 mg twice daily the next day.  If you are taking Xarelto ONCE DAILY and you miss a dose, take it as soon as you remember on the same day then continue your regularly scheduled once daily regimen the next day. Do not take two 20 mg doses of Xarelto at the same time.   Important Safety Information Xarelto is a blood thinner medicine that can cause bleeding. You should call your healthcare provider right away if you experience any of the following: Bleeding from an injury or your nose that does not stop. Unusual colored urine (red or dark brown) or  unusual colored stools (red or black). Unusual bruising for unknown reasons. A serious fall or if you hit your head (even if there is no bleeding).  Some medicines may interact with Xarelto and might increase your risk of bleeding while on Xarelto. To help avoid this, consult your healthcare provider or pharmacist prior to using any new prescription or non-prescription medications, including herbals, vitamins, non-steroidal anti-inflammatory drugs (NSAIDs) and supplements.  This website has more information on Xarelto: VisitDestination.com.br.

## 2022-11-20 NOTE — TOC Initial Note (Signed)
Transition of Care [facility]) - Initial/Assessment Note    Patient Details  Name: Nathan Terrell MRN: [id number] Date of Birth: 08/09/1980  Transition [facility]) CM/SW Contact:    Adrian Prows, RN Phone Number: 11/20/2022, 1:37 PM  Clinical Narrative:                 [facility] consult for medication assistance; spoke w/ patient in room; pt says he is from home and plans to return at d/c; he identified POC Amy Broady (spouse) [phone]; pt denies IPV, difficulty paying utilities, and food/housing insecurity; he has transportation; pt says he has glasses/contact lenses; pt says he does not have DME, HH services, or home oxygen; pt also says he does not need medication assistance; no TOC needs.  Expected Discharge Plan: Home/Self Care Barriers to Discharge: No Barriers Identified   Patient Goals and CMS Choice Patient states their goals for this hospitalization and ongoing recovery are:: home   Choice offered to / list presented to : NA      Expected Discharge Plan and Services   Discharge Planning Services: CM Consult     Expected Discharge Date: 11/20/22               DME Arranged: N/A DME Agency: NA       HH Arranged: NA HH Agency: NA        Prior Living Arrangements/Services   Lives with:: Spouse Patient language and need for interpreter reviewed:: Yes Do you feel safe going back to the place where you live?: Yes      Need for Family Participation in Patient Care: Yes (Comment) Care giver support system in place?: Yes (comment) Current home services:  (n/a) Criminal Activity/Legal Involvement Pertinent to Current Situation/Hospitalization: No - Comment as needed  Activities of Daily Living Home Assistive Devices/Equipment: Contact lenses, Built-in shower seat ADL Screening (condition at time of admission) Patient's cognitive ability adequate to safely complete daily activities?: Yes Is the patient deaf or have difficulty hearing?: No Does the patient  have difficulty seeing, even when wearing glasses/contacts?: No Does the patient have difficulty concentrating, remembering, or making decisions?: No Patient able to express need for assistance with ADLs?: Yes Does the patient have difficulty dressing or bathing?: No Independently performs ADLs?: Yes (appropriate for developmental age) Does the patient have difficulty walking or climbing stairs?: No Weakness of Legs: None Weakness of Arms/Hands: None  Permission Sought/Granted Permission sought to share information with : Case Manager Permission granted to share information with : Yes, Verbal Permission Granted  Share Information with NAME: Case Manager     Permission granted to share info w Relationship: Jayven Naill (spouse) [phone]     Emotional Assessment Appearance:: Appears stated age Attitude/Demeanor/Rapport: Gracious Affect (typically observed): Accepting Orientation: : Oriented to Self, Oriented to Place, Oriented to  Time, Oriented to Situation Alcohol / Substance Use: Not Applicable Psych Involvement: No (comment)  Admission diagnosis:  Superior mesenteric vein thrombosis (HCC) [K55.069] Patient Active Problem List   Diagnosis Date Noted   Superior mesenteric vein thrombosis (HCC) 11/19/2022   Colon cancer (HCC) 11/19/2022   Colon polyp 10/27/2022   Adenomatous polyps 08/17/2022   Family history of polyps in the colon 08/17/2022   Polyp, colonic 07/01/2022   Norovirus 07/01/2022   SBO (small bowel obstruction) (HCC) 06/30/2022   Intussusception of colon (HCC) 06/29/2022   PCP:  Medicine, [facility]:   [facility] [phone] - [place], [place] - [address] AT [facility] [address] [place] [address] Phone: [phone] Fax: [phone]     Social Determinants of Health (SDOH) Social History: SDOH Screenings   Food Insecurity: No Food Insecurity (11/20/2022)  Housing: Low Risk   (11/20/2022)  Transportation Needs: No Transportation Needs (11/20/2022)  Utilities: Not At Risk (11/20/2022)  Financial Resource Strain: Low Risk  (06/07/2022)   Received from [facility]  Physical Activity: Sufficiently Active (06/07/2022)   [facility], [facility]  Social Connections: Socially Integrated (06/07/2022)   [facility], [facility]  Stress: No Stress Concern Present (06/07/2022)   [facility]  Tobacco Use: Low Risk  (11/19/2022)   SDOH Interventions: Food Insecurity Interventions: Intervention Not Indicated, Inpatient TOC Housing Interventions: Intervention Not Indicated, Inpatient TOC Transportation Interventions: Intervention Not Indicated, Inpatient TOC Utilities Interventions: Intervention Not Indicated, Inpatient TOC   Readmission Risk Interventions     No data to display

## 2022-11-20 NOTE — Discharge Summary (Signed)
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 mle with medicl history significnt of hx of SBO who presented to ED with bdomen nd bck pin for  week. He thought he hd kidney stones, but he couldn't stretch or reposition to mke it better. He sttes pin lst night nd tody ws 6-8/10. Pin locted mid thorcic re, with occsionl rdition to his stomch. Pin dull.  Tody pin rdited more towrd his stomch. He hs hd no fever/chills. No N/V, but hs hd some dirrhe throughout the week. He went nd sw his PCP tody.  He hd n outptient CT tht suggested mesenteric venous thrombosis. He ws sent to ED tody.   He ws dmitted in mrch of 2025 for intussusception of the colon nd underwent  colonoscopy nd ws found to hve  lrge polyp which ws mostly removed. 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 Genetic referrl ws mde. He then hd surgicl resection of the polyp by generl surgery in July with robotic  ssisted prtil colectomy on 10/27/22 by Dr. Misie Fus. Surgicl pthology reveled invsive modertely differentited colonic denocrcinom. Mrgins  negtive. Lymph nodes negtive.  Admitted for superior mesenteric vein thrombosis.  Dischrging on xrelto.   Hospitl Course:  Assessment nd Pln:  Superior mesenteric vein thrombosis (HCC) 42 yer old mle presenting with one week history of worsening thorcic bck pin with rdition to bdomen who is s/p robotic colectomy on 10/27/22 for denocrcinom found to hve cute thrombosis of superior mesenteric vein. Also hs thrombosis of the peripherl brnches of the right intrheptic portl venous system.  - pin resolved tody, he feels bck to norml - pprecite surgery recommendtions (I discussed with vsculr over phone, no need for vsculr follow up t this time from their stndpoint) - will refer to heme/onc outptient - needs t lest 3 months nticogultion, would recommend discussion with heme prior to discontinuing nticogultion  - would think surgery might be his biggest risk fctor here, lso hx colon cncer (though stge 1) - will defer hypercog w/u to heme/onc if they think necessry outptient    Colon cncer [facility]) S/p robotic ssisted colectomy on 10/27/22 by Dr. Misie Fus for lrge polyp resection found to hve surgicl pthology reveling invsive modertely differentited colonic denocrcinom. Mrgins negtive. Lymph nodes negtive x 27 lymph nodes Hs ppointment with oncology next week. 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 Crdiovsculr: RRR Lungs: unlboerd Abdomen: Soft, nontender, nondistended  Neurologicl: Alert nd oriented 3. Moves ll extremities 4 with equl strength. Crnil nerves II through XII grossly intct. Extremities: No clubbing or cynosis. No edem.   Dischrge Instructions   Dischrge Instructions     Ambultory referrl to Hemtology / Oncology   Complete by: As directed    Cll MD for:  difficulty brething, hedche or visul disturbnces   Complete by: As directed    Cll MD for:  extreme ftigue   Complete by: As directed    Cll MD for:  hives   Complete by: As directed    Cll MD for:  persistnt dizziness or light-hededness   Complete by: As directed    Cll MD for:  persistnt nuse nd vomiting   Complete by: As directed    Cll MD for:  redness, tenderness, or signs of infection (pin, swelling, redness, odor or green/yellow dischrge round incision site)   Complete by: As directed    Cll MD for:  severe uncontrolled pin   Complete by: As directed    Cll MD for:  temperture >100.4   Complete by: As directed    Diet - low sodium hert helthy   Complete by: As directed    Dischrge instructions   Complete by: As directed    You were seen for mesenteric vein thrombosis (blood clot).  You've improved with pin control nd nticogultion.  I think the most likely risk fctor for the blood clot ws probbly the recent surgery.  I'll recommend 3 months of nticogultion with xrelto.  You my be  ble to stop this fter 3 months, but before stopping, I'd recommend  discussion with n outptient hemtologist to discuss your risk fctors nd whether the nticogultion should be continued for  longer period of time.  Avoid spirin or NSAIDs while you're on  blood thinner.  You hd nrrowing of the celic rtery origin.  If you hve issues with pin fter eting or weight loss, discuss this with your PCP nd they my send you to be evluted for  medin rcute ligment syndrome.    Return for new, recurrent, or worsening symptoms.  Plese sk your PCP to request records from this hospitliztion so they know wht ws done nd wht the next steps will be.   Increse ctivity slowly   Complete by: As directed       Allergies s of 11/20/2022   No Known Allergies      Mediction List     TAKE these medictions    multivitmin with minerls tblet Tke 1 tblet by mouth dily with brekfst.   oxyCODONE 5 MG immedite relese tblet Commonly known s: Oxy IR/ROXICODONE Tke 1 tblet (5 mg totl) by mouth every 6 (six) hours s needed for severe pin.   Rivroxbn Strter Pck (15 mg nd 20 mg) Commonly known s: XARELTO STARTER PACK Follow pckge directions: Tke one 15mg  tblet by mouth twice  dy. On dy 22, switch to one 20mg  tblet once  dy. Tke with food.   rivroxbn 20 MG Tbs tblet Commonly known s: XARELTO Tke 1 tblet (20 mg totl) by mouth dily with supper. Use this refill once you've finished the strter pck. Strt tking on: December 12, 2022   TYLENOL **Note De-Identified vi Obfusction** 500 MG tblet Generic drug: cetminophen Tke 500-1,000 mg by mouth every 6 (six) hours s needed for mild pin or hedche.       No Known Allergies    The results of significnt dignostics from this hospitliztion (including imging, microbiology, ncillry nd lbortory) re listed below for reference.    Significnt Dignostic Studies: CT Angio Abd/Pel W nd/or Wo Contrst  Result Dte: 11/19/2022 CLINICAL DATA:  Acute mesenteric ischemi, bdominl nd bck pin, sttus post right hemicolectomy, colon cncer. EXAM: CTA ABDOMEN AND PELVIS WITHOUT AND WITH CONTRAST TECHNIQUE: Multidetector CT imging of the bdomen nd pelvis ws performed using the stndrd protocol during bolus dministrtion of intrvenous contrst. Multiplnr reconstructed imges nd MIPs were obtined nd reviewed to evlute the vsculr ntomy. RADIATION DOSE  REDUCTION: This exm ws performed ccording to the deprtmentl dose-optimiztion progrm which includes utomted exposure control, djustment of the mA nd/or kV ccording to ptient size nd/or use of itertive reconstruction technique. CONTRAST:  OMNIPAQUE IOHEXOL 350 MG/ML SOLN COMPARISON:  06/28/2022 FINDINGS: VASCULAR Aort: Norml cliber ort without neurysm, dissection, vsculitis or significnt stenosis. Celic: High-grde stenosis of the celic xis origin seen relted to mss effect by the medin rcute ligment, unchnged. Distlly widely ptent without evidence of neurysm or dissection. SMA: Ptent without evidence of neurysm, dissection, vsculitis or significnt stenosis. Collterliztion to the celic distribution vi pncreticoduodenl nd gstroduodenl  rteries. Renls: Dul renl rteries bilterlly re widely ptent nd demonstrte norml vsculr morphology. No neurysm or dissection. IMA: Ptent without evidence of neurysm, dissection, vsculitis or significnt stenosis. Inflow: Ptent without evidence of neurysm, dissection, vsculitis or significnt stenosis. Proximl Outflow: Bilterl common femorl nd visulized portions of the superficil nd profund femorl rteries re ptent without evidence of neurysm, dissection, vsculitis or significnt stenosis. Veins: There is cute thrombosis of the superior mesenteric vein s well s multiple more peripherl mesenteric venules with collterliztion to the inferior mesenteric vein which remins ptent. The splenic vein nd min portl vein re ptent though thrombus minimlly extends into the  portosplenic confluence, best pprecited on coronl imge # 68/15. There is peripherl thrombosis of the right posterior portl venous brnches best pprecited on imge # 26/19. Review of the MIP imges confirms the bove findings. NON-VASCULAR Lower chest: No cute bnormlity. Heptobiliry: No enhncing intrheptic mss  identified. No intr or extrheptic biliry ductl diltion. Gllbldder unremrkble. Pncres: Unremrkble. No pncretic ductl dilttion or surrounding inflmmtory chnges. Spleen: Norml in size without focl bnormlity. Adrenls/Urinry Trct: Adrenl glnds re unremrkble. Kidneys re norml, without renl clculi, focl lesion, or hydronephrosis. Bldder is unremrkble. Stomch/Bowel: Mild colonic diverticulosis. Surgicl chnges of right hemicolectomy re identified in the intervl since prior exmintion. Mild mesenteric edem surrounding the cutely thrombosed segments of the mesenteric venous system. There is, however, norml bowel wll enhncement. No pneumtosis or free intrperitonel gs. No free intrperitonel fluid. No evidence of obstruction. Lymphtic: No pthologic denopthy within the bdomen nd pelvis. Reproductive: Prostte is unremrkble. Other: No bdominl wll herni Musculoskeletl: No cute bone bnormlity. No lytic or blstic bone lesion. IMPRESSION: 1. Acute thrombosis of the superior mesenteric vein s well s multiple more peripherl mesenteric venules with collterliztion to the inferior mesenteric vein which remins ptent. Thrombus minimlly extends into the portosplenic confluence. Thrombosis of the peripherl brnches of the right intrheptic portl venous system. No evidence of frnk bowel ischemi. 2. 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 Robust collterliztion by the superior mesenteric rtery. 3. Mild colonic diverticulosis. 4. Interval right hemicolectomy. These results were called by telephone at the time of interpretation on 11/19/2022 at 8:12 pm to provider DAVID YAO , who verbally acknowledged these results. Electronically Signed   By: Helyn Numbers M.D.   On: 11/19/2022 20:13    Microbiology: No results found for this or any previous visit (from the past 240 hour(s)).    Labs: Basic Metabolic Panel: Recent Labs  Lab 11/19/22 1816  NA 136  K 4.0  CL 100  CO2 27  GLUCOSE 95  BUN 18  CREATININE 1.18  CALCIUM 8.8*   Liver Function Tests: Recent Labs  Lab 11/19/22 1816  AST 23  ALT 27  ALKPHOS 68  BILITOT 0.3  PROT 7.1  ALBUMIN 3.7   No results for input(s): "LIPASE", "AMYLASE" in the last 168 hours. No results for input(s): "AMMONIA" in the last 168 hours. CBC: Recent Labs  Lab 11/19/22 1816 11/20/22 0254  WBC 7.7 7.0  NEUTROABS 4.8  --   HGB 13.5 11.5*  HCT 40.8 36.0*  MCV 89.1 89.8  PLT 324 252   Cardiac Enzymes: No results for input(s): "CKTOTAL", "CKMB", "CKMBINDEX", "TROPONINI" in the last 168 hours. BNP: BNP (last 3 results) No results for input(s): "BNP" in the last 8760 hours.  ProBNP (last 3 results) No results for input(s): "PROBNP" in the last 8760 hours.  CBG: No results for input(s): "GLUCAP" in the last 168 hours.     Signed:  Lacretia Nicks MD.  [facility] 11/20/2022, 1:11 PM

## 2022-11-20 NOTE — Progress Notes (Signed)
ANTICOAGULATION CONSULT NOTE - Follow-Up Consult  Pharmacy Consult for heparin  Indication: superior mesenteric vein occlusion   No Known Allergies  Patient Measurements: Height: 5\' 10"  (177.8 cm) Weight: 88.5 kg (195 lb) IBW/kg (Calculated) : 73 Heparin Dosing Weight: 88.5 kg  Vital Signs: Temp: 98.2 F (36.8 C) (08/03 0028) Temp Source: Oral (08/03 0028) BP: 111/83 (08/03 0028) Pulse Rate: 50 (08/03 0028)  Labs: Recent Labs    11/19/22 1816 11/20/22 0254  HGB 13.5 11.5*  HCT 40.8 36.0*  PLT 324 252  HEPARINUNFRC  --  0.25*  CREATININE 1.18  --     Estimated Creatinine Clearance: 90.4 mL/min (by C-G formula based on SCr of 1.18 mg/dL).   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  No anticoagulant PTA.  CBC WNL SCr 1.18  11/20/22 Heparin level = 0.25 (subtherapeutic) with heparin gtt @ 1550 units/hr Hgb down 13.5 >> 11.5; PLTC wnl No complications of therapy noted RN noted a brief interruption of therapy (a couple of minutes) to remove air bubbles from line  Goal of Therapy:  Heparin level 0.3-0.7 units/ml Monitor platelets by anticoagulation protocol: Yes   Plan:  Increase heparin gtt to 1700 units/hr Check heparin level 6 hr after rate increased Follow for signs & symptoms of bleeding Daily CBC and heparin level    Terrilee Files, PharmD 11/20/2022 3:59 AM

## 2022-11-20 NOTE — Progress Notes (Signed)
Subjective: Admitted with SMV thrombosis, pain improved with anticoagulation.  Denies nausea  Objective: Vital signs in last 24 hours: Temp:  [98.2 F (36.8 C)-98.6 F (37 C)] 98.4 F (36.9 C) (08/03 0452) Pulse Rate:  [49-84] 49 (08/03 0452) Resp:  [15-18] 15 (08/03 0452) BP: (109-132)/(64-93) 109/69 (08/03 0452) SpO2:  [96 %-100 %] 97 % (08/03 0452) Weight:  [88.5 kg] 88.5 kg (08/02 1635)   Intake/Output from previous day: 08/02 0701 - 08/03 0700 In: 386.8 [P.O.:240; I.V.:146.8] Out: -  Intake/Output this shift: No intake/output data recorded.   General appearance: alert and cooperative GI: soft, non-distended   Lab Results:  Recent Labs    11/19/22 1816 11/20/22 0254  WBC 7.7 7.0  HGB 13.5 11.5*  HCT 40.8 36.0*  PLT 324 252   BMET Recent Labs    11/19/22 1816  NA 136  K 4.0  CL 100  CO2 27  GLUCOSE 95  BUN 18  CREATININE 1.18  CALCIUM 8.8*   PT/INR No results for input(s): "LABPROT", "INR" in the last 72 hours. ABG No results for input(s): "PHART", "HCO3" in the last 72 hours.  Invalid input(s): "PCO2", "PO2"  MEDS, Scheduled   Studies/Results: CT Angio Abd/Pel W and/or Wo Contrast  Result Date: 11/19/2022 CLINICAL DATA:  Acute mesenteric ischemia, abdominal and back pain, status post right hemicolectomy, colon cancer. EXAM: CTA ABDOMEN AND PELVIS WITHOUT AND WITH CONTRAST TECHNIQUE: Multidetector CT imaging of the abdomen and pelvis was performed using the standard protocol during bolus administration of intravenous contrast. Multiplanar reconstructed images and MIPs were obtained and reviewed to evaluate the vascular anatomy. RADIATION DOSE REDUCTION: This exam was performed according to the departmental dose-optimization program which includes automated exposure control, adjustment of the mA and/or kV according to patient size and/or use of iterative reconstruction technique. CONTRAST:  OMNIPAQUE IOHEXOL 350 MG/ML SOLN COMPARISON:   06/28/2022 FINDINGS: VASCULAR Aorta: Normal caliber aorta without aneurysm, dissection, vasculitis or significant stenosis. 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 Distally widely patent without evidence of aneurysm or dissection. SMA: Patent without evidence of aneurysm, dissection, vasculitis or significant stenosis. Collateralization to the celiac distribution via pancreaticoduodenal and gastroduodenal a arteries. Renals: Dual renal arteries bilaterally are widely patent and demonstrate normal vascular morphology. No aneurysm or dissection. IMA: Patent without evidence of aneurysm, dissection, vasculitis or significant stenosis. Inflow: Patent without evidence of aneurysm, dissection, vasculitis or significant stenosis. Proximal Outflow: Bilateral common femoral and visualized portions of the superficial and profunda femoral arteries are patent without evidence of aneurysm, dissection, vasculitis or significant stenosis. Veins: There is acute thrombosis of the superior mesenteric vein as well as multiple more peripheral mesenteric venules with collateralization to the inferior mesenteric vein which remains patent. The splenic vein and main portal vein are patent though thrombus minimally extends into the a portosplenic confluence, best appreciated on coronal image # 68/15. There is peripheral thrombosis of the right posterior portal venous branches best appreciated on image # 26/19. Review of the MIP images confirms the above findings. NON-VASCULAR Lower chest: No acute abnormality. Hepatobiliary: No enhancing intrahepatic mass identified. No intra or extrahepatic biliary ductal dilation. Gallbladder unremarkable. Pancreas: Unremarkable. No pancreatic ductal dilatation or surrounding inflammatory changes. Spleen: Normal in size without focal abnormality. Adrenals/Urinary Tract: Adrenal glands are unremarkable. Kidneys are normal,  without renal calculi, focal lesion, or hydronephrosis. Bladder is unremarkable. Stomach/Bowel: Mild colonic diverticulosis. Surgical changes of right hemicolectomy are identified in  the interval since prior examination. Mild mesenteric edema surrounding the acutely thrombosed segments of the mesenteric venous system. There is, however, normal bowel wall enhancement. No pneumatosis or free intraperitoneal gas. No free intraperitoneal fluid. No evidence of obstruction. Lymphatic: No pathologic adenopathy within the abdomen and pelvis. Reproductive: Prostate is unremarkable. Other: No abdominal wall hernia Musculoskeletal: No acute bone abnormality. No lytic or blastic bone lesion. IMPRESSION: 1. Acute thrombosis of the superior mesenteric vein as well as multiple more peripheral mesenteric venules with collateralization to the inferior mesenteric vein which remains patent. Thrombus minimally extends into the portosplenic confluence. Thrombosis of the peripheral branches of the right intrahepatic portal venous system. No evidence of frank bowel ischemia. 2. 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 Robust collateralization by the superior mesenteric artery. 3. Mild colonic diverticulosis. 4. Interval right hemicolectomy. These results were called by telephone at the time of interpretation on 11/19/2022 at 8:12 pm to provider DAVID YAO , who verbally acknowledged these results. Electronically Signed   By: Helyn Numbers M.D.   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  No post surgical issues identified.  I see no anatomical reason surgery would cause thrombosis in this area. Unclear if median arcuate ligament and celiac artery stenosis playing a role in this.  Pt has no symptoms of MALS.  May need vascular surgery evaluation  Needs onc/hematology eval and f/u for stage 1 colon cancer and management of SMV thrombosis.  Would rec hypercoagulability work up Will see PRN going forward  Vanita Panda, MD  Colorectal and General Surgery [facility]     LOS: 0 days     .Vanita Panda, MD [facility], [place]    11/20/2022 9:31 AM

## 2022-11-29 ENCOUNTER — Encounter: Payer: Self-pay | Admitting: *Deleted

## 2022-11-29 NOTE — Progress Notes (Signed)
PATIENT NAVIGATOR PROGRESS NOTE  Name: Nathan Terrell Date: 11/29/2022 MRN: [id number]  DOB: 1981/03/29   Reason for visit:  New pt appt  Comments:  New patient appt scheduled with Dr Truett Perna for thrombosis of mesenteric vein    Time spent counseling/coordinating care: 30-45 minutes

## 2022-12-09 ENCOUNTER — Inpatient Hospital Stay: Payer: BC Managed Care – PPO | Admitting: Oncology

## 2022-12-27 ENCOUNTER — Inpatient Hospital Stay: Payer: BC Managed Care – PPO | Admitting: Oncology

## 2022-12-27 ENCOUNTER — Inpatient Hospital Stay: Payer: BC Managed Care – PPO | Attending: Oncology

## 2022-12-27 VITALS — BP 125/68 | HR 58 | Temp 98.2°F | Resp 18 | Ht 70.0 in | Wt 190.6 lb

## 2022-12-27 DIAGNOSIS — F1090 Alcohol use, unspecified, uncomplicated: Secondary | ICD-10-CM | POA: Insufficient documentation

## 2022-12-27 DIAGNOSIS — Z7901 Long term (current) use of anticoagulants: Secondary | ICD-10-CM | POA: Insufficient documentation

## 2022-12-27 DIAGNOSIS — K561 Intussusception: Secondary | ICD-10-CM

## 2022-12-27 DIAGNOSIS — Z79899 Other long term (current) drug therapy: Secondary | ICD-10-CM | POA: Insufficient documentation

## 2022-12-27 DIAGNOSIS — C184 Malignant neoplasm of transverse colon: Secondary | ICD-10-CM | POA: Diagnosis present

## 2022-12-27 DIAGNOSIS — R760 Raised antibody titer: Secondary | ICD-10-CM | POA: Diagnosis not present

## 2022-12-27 DIAGNOSIS — I8290 Acute embolism and thrombosis of unspecified vein: Secondary | ICD-10-CM

## 2022-12-27 LAB — CEA (ACCESS): CEA (CHCC): <1 ng/mL (ref 0.00–5.00)

## 2022-12-27 LAB — ANTITHROMBIN III: AntiThromb III Func: 108 % (ref 75–120)

## 2022-12-27 NOTE — Progress Notes (Signed)
[facility] New Patient Consult   Requesting MD: Gilmar Mucklow 42 y.o.  1981/01/21    Reason for Consult: Colon cancer, SMV thrombosis   HPI: [name] presented to the emergency room 06/28/2022 with abdominal cramping and nausea/vomiting.  He had diarrhea.  A CT of the abdomen and pelvis revealed a short segment intussusception of the transverse colon.  Soft tissue fullness noted at the lead point.  A stool sample was positive for norovirus. He was admitted for further evaluation. A colonoscopy on 06/30/2022 revealed sessile polyps in the descending colon, ascending colon, and cecum.  The polyps were removed.  A greater than 50 mm polyp was found in the transverse colon.  The polyp was multilobulated and semipedunculated.  The polyp was incompletely removed.  The area was tattooed.  The pathology revealed tubular adenomas at the descending and ascending colon.  The transverse polyp returned as a tubulovillous adenoma with at least high-grade dysplasia.  There was suspicion for submucosal invasion. Dr. Maisie Fus was consulted and was taken to the operating room for a assisted extended right colectomy 10/27/2022.  A mass was noted in the distal transverse colon.  No evidence of metastatic disease.  He was discharged to home on 10/30/2022.  He presented to the emergency room on 11/19/2022 with back pain.  A CT angiogram Abdo/pelvis revealed acute thrombosis of the superior mesenteric vein and multiple more peripheral mesenteric venules.  This extends into the porta splenic confluence with peripheral thrombosis of the right posterior portal venous branches.  Mild mesenteric edema around the acutely thrombosed mesenteric venous system. He was admitted and placed on heparin anticoagulation.  He reports rapid improvement in pain after he was placed on anticoagulation therapy.  He was discharged on Xarelto.  He feels well at present.  He is having bowel movements.  He  has no previous history of venous or arterial thrombosis.  Past Medical History:  Diagnosis Date   Adenomatous polyps    Cough 03/12/2013   DJD (degenerative joint disease) 02/17/2013   right shoulder (acromial)   Family history of polyps in the colon    SLAP lesion, type II 02/17/2013   right    Past Surgical History:  Procedure Laterality Date   COLONOSCOPY WITH PROPOFOL N/A 06/30/2022   Procedure: COLONOSCOPY WITH PROPOFOL;  Surgeon: Jeani Hawking, MD;  Location: [facility];  Service: Gastroenterology;  Laterality: N/A;   HEMOSTASIS CLIP PLACEMENT  06/30/2022   Procedure: HEMOSTASIS CLIP PLACEMENT;  Surgeon: Jeani Hawking, MD;  Location: [facility];  Service: Gastroenterology;;   NO PAST SURGERIES     POLYPECTOMY  06/30/2022   Procedure: POLYPECTOMY;  Surgeon: Jeani Hawking, MD;  Location: [facility];  Service: Gastroenterology;;   [name]  06/30/2022   Procedure: [name];  Surgeon: Jeani Hawking, MD;  Location: [facility];  Service: Gastroenterology;;   SHOULDER ARTHROSCOPY WITH SUBACROMIAL DECOMPRESSION, ROTATOR CUFF REPAIR AND BICEP TENDON REPAIR Right 03/20/2013   Procedure: RIGHT SHOULDER ARTHROSCOPY WITH SUBACROMIAL DECOMPRESSION, DISTAL CLAVICLE RESECTION, OPEN BICEPS TENODESIS;  Surgeon: Wyn Forster., MD;  Location: [facility];  Service: Orthopedics;  Laterality: Right;   SUBMUCOSAL TATTOO INJECTION  06/30/2022   Procedure: SUBMUCOSAL TATTOO INJECTION;  Surgeon: Jeani Hawking, MD;  Location: [facility];  Service: Gastroenterology;;    Medications: Reviewed  Allergies: No Known Allergies  Family history: A paternal great aunt had lung cancer in her 30s.  No family history of venous thrombotic disease.  Social History:  Lives with his wife and kids in [place].  He is self-employed in [employer]  He does not use cigarettes.  He drinks alcohol approximately 4 nights per week.  He has several drinks.  No risk factor  for HIV or hepatitis.  ROS:   Positives include: Diarrhea and nausea/vomiting prior to hospital admission March 2024, diarrhea and a headache 1 week ago.  A complete ROS was otherwise negative.  Physical Exam:  Blood pressure 125/68, pulse (!) 58, temperature 98.2 F (36.8 C), temperature source Oral, resp. rate 18, height 5\' 10"  (1.778 m), weight 190 lb 9.6 oz (86.5 kg), SpO2 100%.  HEENT: Oropharynx without visible mass, neck without mass Lungs: Clear bilaterally Cardiac: Regular rate and rhythm Abdomen: No hepatosplenomegaly, no mass, nontender, healed surgical incisions Vascular: No leg edema Lymph nodes: No cervical, supraclavicular, axillary, or inguinal nodes Neurologic: Alert and oriented, motor exam appears intact in the upper and lower extremities bilaterally Skin: No rash Musculoskeletal: No spine tenderness   LAB:  CBC  Lab Results  Component Value Date   WBC 7.0 11/20/2022   HGB 11.5 (L) 11/20/2022   HCT 36.0 (L) 11/20/2022   MCV 89.8 11/20/2022   PLT 252 11/20/2022   NEUTROABS 4.8 11/19/2022        CMP  Lab Results  Component Value Date   NA 136 11/19/2022   K 4.0 11/19/2022   CL 100 11/19/2022   CO2 27 11/19/2022   GLUCOSE 95 11/19/2022   BUN 18 11/19/2022   CREATININE 1.18 11/19/2022   CALCIUM 8.8 (L) 11/19/2022   PROT 7.1 11/19/2022   ALBUMIN 3.7 11/19/2022   AST 23 11/19/2022   ALT 27 11/19/2022   ALKPHOS 68 11/19/2022   BILITOT 0.3 11/19/2022   GFRNONAA >60 11/19/2022     Imaging: CT images from 11/19/2022 reviewed with Mr. Wilbanks.  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 (, status post an extended right colectomy 10/27/2022 Moderately differentiated adenocarcinoma mainly involving the submucosa with a small focus involving the superficial muscularis propria, 0/27 nodes, no loss of mismatch repair protein expression CT abdomen/pelvis 06/28/2022-short segment of  colocolonic intussusception in the transverse colon with soft tissue fullness at the lead point Colonoscopy 06/30/2022-greater than 50 mm polyp in the transverse colon, resection incomplete-to be a villous adenoma with at least high-grade dysplasia, suspicion for submucosal invasion  Multiple colonic polyps on colonoscopy 06/30/2022-tubular adenomas removed from the ascending and descending colon 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 Admission 11/19/2022-heparin, discharged on Xarelto  4.  Norovirus 06/29/2022  5.  Moderate-heavy alcohol use     Disposition:   Mr. Tritz presented with transverse colon intussusception on 06/28/2022.  He underwent a colonoscopy for removal of a large transverse colon polyp.  An extended right colectomy confirmed a stage I invasive carcinoma of the transverse colon.  I discussed the colon cancer diagnosis with Mr. Lurry.  We reviewed details of the pathology report.  We discussed the prognosis and adjuvant treatment options.  He has a good prognosis for long-term disease-free survival.  There is no indication for adjuvant chemotherapy.  He  does not appear to have hereditary nonpolyposis colon cancer syndrome, but his family members are at increased risk of developing colorectal cancer and should receive appropriate screening.  He saw the genetics counselor and decided against genetic testing.  Mr. Kittell should continue colonoscopy follow-up with Dr. Elnoria Howard.  Mr. Rill did not undergo CEA or chest CT screening when he was diagnosed with colon cancer in July.  We  ordered the studies today.  Mr. Agredano was interested in obtaining a "PET "scan and additional testing to survey for remaining colon cancer.  We will check a CEA.  We discussed circulating tumor DNA testing.  He would like to have a test for circulating tumor DNA.  He was diagnosed with acute SMV thrombosis when he presented with back pain on 11/19/2022.  The SMV thrombus is likely associated with the recent abdominal surgeries including the right colectomy and colonoscopy/polyp resection.  He does not have a clinical diagnosis of cirrhosis he has a history of significant alcohol use.  I recommend at least 6 months of Xarelto anticoagulation.  Mr. Bogusz does not have clinical evidence to suggest an underlying hypercoagulation syndrome such as a myeloproliferative disorder or active malignancy.  There is no laboratory evidence for PNH.  He does not have a family history suggestive of a hypercoagulation syndrome.  We obtained a hypercoagulation panel today.  He will continue Xarelto anticoagulation.  He will return for an office visit in approximately 4 months to discuss the indication for discontinuing anticoagulation.  I recommended he decrease alcohol use. Thornton Papas, MD  12/27/2022, 12:08 PM

## 2022-12-28 LAB — LUPUS ANTICOAGULANT PANEL
DRVVT: 68.5 s — ABNORMAL HIGH (ref 0.0–47.0)
PTT Lupus Anticoagulant: 39.8 s (ref 0.0–43.5)

## 2022-12-28 LAB — BETA-2-GLYCOPROTEIN I ABS, IGG/M/A
Beta-2 Glyco I IgG: <9 GPI IgG units (ref 0–20)
Beta-2-Glycoprotein I IgA: <9 GPI IgA units (ref 0–25)
Beta-2-Glycoprotein I IgM: <9 GPI IgM units (ref 0–32)

## 2022-12-28 LAB — PROTEIN S, TOTAL: Protein S Ag, Total: 90 % (ref 60–150)

## 2022-12-28 LAB — DRVVT CONFIRM: dRVVT Confirm: 1.4 ratio — ABNORMAL HIGH (ref 0.8–1.2)

## 2022-12-28 LAB — PROTEIN C ACTIVITY: Protein C Activity: 126 % (ref 73–180)

## 2022-12-28 LAB — DRVVT MIX: dRVVT Mix: 48.4 s — ABNORMAL HIGH (ref 0.0–40.4)

## 2022-12-28 LAB — PROTEIN S ACTIVITY: Protein S Activity: 102 % (ref 63–140)

## 2022-12-29 LAB — PROTEIN C, TOTAL: Protein C, Total: 91 % (ref 60–150)

## 2022-12-29 LAB — CARDIOLIPIN ANTIBODIES, IGG, IGM, IGA
Anticardiolipin IgA: <9 U/mL (ref 0–11)
Anticardiolipin IgG: <9 GPL U/mL (ref 0–14)
Anticardiolipin IgM: <9 [MPL'U]/mL (ref 0–12)

## 2023-01-03 LAB — PROTHROMBIN GENE MUTATION

## 2023-01-03 LAB — MOLECULAR PATHOLOGY

## 2023-01-04 ENCOUNTER — Ambulatory Visit (HOSPITAL_BASED_OUTPATIENT_CLINIC_OR_DEPARTMENT_OTHER)
Admission: RE | Admit: 2023-01-04 | Discharge: 2023-01-04 | Disposition: A | Discharge status: Home / Self Care | Payer: BC Managed Care – PPO | Source: Ambulatory Visit | Attending: Oncology | Admitting: Oncology

## 2023-01-04 DIAGNOSIS — C184 Malignant neoplasm of transverse colon: Secondary | ICD-10-CM | POA: Diagnosis present

## 2023-01-04 LAB — FACTOR 5 LEIDEN

## 2023-01-04 LAB — HOMOCYSTEINE: Homocysteine: 8.9 umol/L

## 2023-05-02 ENCOUNTER — Inpatient Hospital Stay: Payer: BC Managed Care – PPO | Attending: Oncology | Admitting: Oncology

## 2023-05-02 VITALS — BP 116/74 | HR 50 | Temp 97.9°F | Resp 18 | Ht 70.0 in | Wt 200.1 lb

## 2023-05-02 DIAGNOSIS — Z85038 Personal history of other malignant neoplasm of large intestine: Secondary | ICD-10-CM | POA: Diagnosis present

## 2023-05-02 DIAGNOSIS — D6862 Lupus anticoagulant syndrome: Secondary | ICD-10-CM | POA: Diagnosis not present

## 2023-05-02 DIAGNOSIS — Z7901 Long term (current) use of anticoagulants: Secondary | ICD-10-CM | POA: Diagnosis not present

## 2023-05-02 DIAGNOSIS — C184 Malignant neoplasm of transverse colon: Secondary | ICD-10-CM | POA: Diagnosis not present

## 2023-05-02 DIAGNOSIS — Z86718 Personal history of other venous thrombosis and embolism: Secondary | ICD-10-CM | POA: Diagnosis not present

## 2023-05-02 NOTE — Progress Notes (Addendum)
 [facility] OFFICE PROGRESS NOTE   Diagnosis: Colon cancer, SMV thrombus  INTERVAL HISTORY:   Nathan Terrell returns as scheduled.  He continues Xarelto  anticoagulation.  He reports a few episodes of blood on the toilet tissue several weeks ago.  No other bleeding.  No recurrent abdomen/back pains similar to the pain he experienced last summer.  He has chronic low back pain and is seeing a land.    Objective:  Vital signs in last 24 hours:  Blood pressure 116/74, pulse (!) 50, temperature 97.9 F (36.6 C), temperature source Temporal, resp. rate 18, height 5' 10 (1.778 m), weight 200 lb 1.6 oz (90.8 kg), SpO2 100%.    Lymphatics: No cervical, supraclavicular, axillary, or inguinal nodes Resp: Lungs clear bilaterally Cardio: Regular rate and rhythm GI: No hepatosplenomegaly, nontender, no mass Vascular: No leg edema  Lab Results:  Lab Results  Component Value Date   WBC 7.0 11/20/2022   HGB 11.5 (L) 11/20/2022   HCT 36.0 (L) 11/20/2022   MCV 89.8 11/20/2022   PLT 252 11/20/2022   NEUTROABS 4.8 11/19/2022    CMP  Lab Results  Component Value Date   NA 136 11/19/2022   K 4.0 11/19/2022   CL 100 11/19/2022   CO2 27 11/19/2022   GLUCOSE 95 11/19/2022   BUN 18 11/19/2022   CREATININE 1.18 11/19/2022   CALCIUM 8.8 (L) 11/19/2022   PROT 7.1 11/19/2022   ALBUMIN 3.7 11/19/2022   AST 23 11/19/2022   ALT 27 11/19/2022   ALKPHOS 68 11/19/2022   BILITOT 0.3 11/19/2022   GFRNONAA >60 11/19/2022    Lab Results  Component Value Date   CEA <1.00 12/27/2022    Medications: I have reviewed the patient's current medications.   Assessment/Plan: Colon cancer, transverse colon, age 43 (pT2pN0 (, status post an extended right colectomy 10/27/2022 Moderately differentiated adenocarcinoma mainly involving the submucosa with a small focus involving the superficial muscularis propria, 0/27 nodes, no loss of mismatch repair protein expression, MSS CT  abdomen/pelvis 06/28/2022-short segment of colocolonic intussusception in the transverse colon with soft tissue fullness at the lead point Colonoscopy 06/30/2022-greater than 50 mm polyp in the transverse colon, resection incomplete-to be a villous adenoma with at least high-grade dysplasia, suspicion for submucosal invasion 12/27/2022: Guardant reveal-negative  Multiple colonic polyps on colonoscopy 06/30/2022-tubular adenomas removed from the ascending and descending colon 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 Admission 11/19/2022-heparin , discharged on Xarelto , completed 05/30/2023 Hypercoagulation panel 12/27/2022 negative aside from a positive lupus anticoagulant   4.  Norovirus 06/29/2022  5.  Moderate-heavy alcohol use     Disposition: Nathan Terrell is in clinical remission from colon cancer.  He will be due for a surveillance colonoscopy with Dr. Rollin this summer.  He saw the genetics counselor last year, but declined genetic testing.  He agrees to genetic testing.  We will arrange for a lab draw when he returns next month.  He has been maintained on anticoagulation therapy for the past 6 months.  He appears to have developed an acute SMV thrombus related to the colon surgery.  I have a low clinical suspicion for an underlying hypercoagulation syndrome.  He had  a negative hypercoagulation panel aside from a positive functional lupus anticoagulant.  This was likely a false positive in the setting of anticoagulation therapy.  He will discontinue Xarelto  05/30/2023.  He will return 1 week later  for a lupus anticoagulant panel.  Nathan Terrell will return for an office visit in 6 months.  He will call for recurrent abdomen/back pain.  [name], MD  05/02/2023  12:01 PM

## 2023-05-03 ENCOUNTER — Other Ambulatory Visit: Payer: Self-pay | Admitting: *Deleted

## 2023-05-03 DIAGNOSIS — C184 Malignant neoplasm of transverse colon: Secondary | ICD-10-CM

## 2023-05-03 NOTE — Progress Notes (Signed)
 Genetic screening lab orders entered per Dr Truett Perna

## 2023-05-19 ENCOUNTER — Encounter: Payer: Self-pay | Admitting: Oncology

## 2023-06-02 ENCOUNTER — Telehealth: Payer: Self-pay | Admitting: Genetic Counselor

## 2023-06-02 NOTE — Telephone Encounter (Signed)
Nathan Terrell messaged that patient has agreed to genetic testing.  Blood will be drawn on 2/17 and sent to [facility].  Estimated OOP cost is $0.

## 2023-06-06 ENCOUNTER — Other Ambulatory Visit: Payer: BC Managed Care – PPO

## 2023-06-07 ENCOUNTER — Telehealth: Payer: Self-pay

## 2023-06-07 ENCOUNTER — Inpatient Hospital Stay: Payer: BC Managed Care – PPO | Attending: Oncology

## 2023-06-07 DIAGNOSIS — Z85038 Personal history of other malignant neoplasm of large intestine: Secondary | ICD-10-CM | POA: Insufficient documentation

## 2023-06-07 DIAGNOSIS — C184 Malignant neoplasm of transverse colon: Secondary | ICD-10-CM

## 2023-06-07 LAB — CEA (ACCESS): CEA (CHCC): <1 ng/mL (ref 0.00–5.00)

## 2023-06-07 NOTE — Telephone Encounter (Unsigned)
 Mychart message sent.

## 2023-06-08 LAB — LUPUS ANTICOAGULANT PANEL
DRVVT: 39.3 s (ref 0.0–47.0)
PTT Lupus Anticoagulant: 34.3 s (ref 0.0–43.5)

## 2023-06-15 ENCOUNTER — Encounter: Payer: Self-pay | Admitting: Genetic Counselor

## 2023-06-15 ENCOUNTER — Telehealth: Payer: Self-pay | Admitting: *Deleted

## 2023-06-15 DIAGNOSIS — Z1379 Encounter for other screening for genetic and chromosomal anomalies: Secondary | ICD-10-CM | POA: Insufficient documentation

## 2023-06-15 NOTE — Telephone Encounter (Signed)
-----   Message from Thornton Papas sent at 06/14/2023  3:41 PM EST ----- Please call patient, the lupus anticoagulant test is negative, is he taking Xarelto?

## 2023-06-15 NOTE — Telephone Encounter (Signed)
 Called Nathan Terrell w/negative lupus anticoagulant test. Confirmed he had been off his xarelto for ~ 2 weeks before the lab test and has not resumed it.

## 2023-06-16 ENCOUNTER — Ambulatory Visit: Payer: Self-pay | Admitting: Genetic Counselor

## 2023-06-16 ENCOUNTER — Telehealth: Payer: Self-pay | Admitting: Genetic Counselor

## 2023-06-16 DIAGNOSIS — Z1379 Encounter for other screening for genetic and chromosomal anomalies: Secondary | ICD-10-CM

## 2023-06-16 NOTE — Telephone Encounter (Signed)
 Revealed negative genetic testing.  Discussed that we do not know why he has Stage I CRC or why there is colon polyps in the family. It could be due to a different gene that we are not testing, or maybe our current technology may not be able to pick something up.  It will be important for him to keep in contact with genetics to keep up with whether additional testing may be needed.   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  This will not change medical management.

## 2023-06-16 NOTE — Progress Notes (Signed)
 HPI:  [name] was previously seen in the [facility] in April 2024 clinic due to a personal and family history of colon polyps and concerns regarding a hereditary predisposition to cancer.  Since that time, he was diagnosed with Stage I colon cancer. Please refer to our prior cancer genetics clinic note for more information regarding our discussion, assessment and recommendations, at the time. [name] recent genetic test results were disclosed to him, as were recommendations warranted by these results. These results and recommendations are discussed in more detail below.  CANCER HISTORY:  Oncology History  Colon cancer (HCC)  11/19/2022 Initial Diagnosis   Colon cancer (HCC)   12/27/2022 Cancer Staging   Staging form: Colon and Rectum, AJCC 8th Edition - Pathologic: Stage I (pT2, pN0, cM0) - Signed by Ladene Artist, MD on 12/27/2022 Total positive nodes: 0 Histologic grading system: 4 grade system Histologic grade (G): G2 Residual tumor (R): R0 - None Lymph-vascular invasion (LVI): LVI not present (absent)/not identified Tumor deposits (TD): Absent Perineural invasion (PNI): Absent   06/15/2023 Genetic Testing   Negative genetic testing on the Multi-Cancer+RNA panel testing.  POLD1 c.14G>A (p.Arg5Gln) VUS identified.  The report date is June 15, 2023.  The Multi-Cancer + RNA Panel offered by [facility] includes sequencing and/or deletion/duplication analysis of the following 70 genes:  AIP*, ALK, APC*, ATM*, AXIN2*, BAP1*, BARD1*, BLM*, BMPR1A*, BRCA1*, BRCA2*, BRIP1*, CDC73*, CDH1*, CDK4, CDKN1B*, CDKN2A, CHEK2*, CTNNA1*, DICER1*, EPCAM (del/dup only), EGFR, FH*, FLCN*, GREM1 (promoter dup only), HOXB13, KIT, LZTR1, MAX*, MBD4, MEN1*, MET, MITF, MLH1*, MSH2*, MSH3*, MSH6*, MUTYH*, NF1*, NF2*, NTHL1*, PALB2*, PDGFRA, PMS2*, POLD1*, POLE*, POT1*, PRKAR1A*, PTCH1*, PTEN*, RAD51C*, RAD51D*, RB1*, RET, SDHA* (sequencing only), SDHAF2*, SDHB*, SDHC*, SDHD*, SMAD4*, SMARCA4*,  SMARCB1*, SMARCE1*, STK11*, SUFU*, TMEM127*, TP53*, TSC1*, TSC2*, VHL*. RNA analysis is performed for * genes.      FAMILY HISTORY:  We obtained a detailed, 4-generation family history.  Significant diagnoses are listed below: Family History  Problem Relation Age of Onset   Colon polyps Father    Colon polyps Maternal Aunt    Colon polyps Maternal Grandfather    Clotting disorder Maternal Grandfather    Other Paternal Grandmother        'tumor' in heart   Dementia Paternal Grandfather    Colon polyps Paternal Grandfather    Colon cancer Other        MGF's father   Stomach cancer Paternal Great-grandmother        PGM's mother   Colon cancer Maternal Great-grandfather        MGM's father   Brain cancer Maternal Great-grandfather        MGMs father       UPDATED FAMILY HISTORY: The patient has a son and daughter who are cancer free. He has a brother and sister who are cancer free. His brother is being referred for a colonoscopy and his sister had a colonoscopy at 85 due to POTS syndrome.  His sister had another colonoscopy and was found to have 2-3 polyps, with one of them being large like his.  Both parents are living.   The patient's mother has not had a colonoscopy and is 98.  She had three sisters and a brother.  One sister had two tubular adenomas.  The maternal grandmother is living and the grandfather is deceased.  The grandfather had colon polyps.  His father had colon cancer.  The grandmother's father had colon cancer and brain cancer.   The patient's father  had five colon polyps.  He has two sisters and a brother who are cancer free.  His mother had a tumor in her heart.  Her sister had leukemia and her mother had stomach cancer.  The grandfather had colon polyps.  His mother had an unknown cancer.   [name] is unaware of previous family history of genetic testing for hereditary cancer risks. Patient's maternal ancestors are of [place] and [place] descent, and paternal  ancestors are of [place] and [place] descent. There is no reported Ashkenazi Jewish ancestry. There is no known consanguinity  GENETIC TEST RESULTS: Genetic testing reported out on June 16, 2023 through the Multi-Cancer+RNA cancer panel found no pathogenic mutations. The Multi-Cancer + RNA Panel offered by [facility] includes sequencing and/or deletion/duplication analysis of the following 70 genes:  AIP*, ALK, APC*, ATM*, AXIN2*, BAP1*, BARD1*, BLM*, BMPR1A*, BRCA1*, BRCA2*, BRIP1*, CDC73*, CDH1*, CDK4, CDKN1B*, CDKN2A, CHEK2*, CTNNA1*, DICER1*, EPCAM (del/dup only), EGFR, FH*, FLCN*, GREM1 (promoter dup only), HOXB13, KIT, LZTR1, MAX*, MBD4, MEN1*, MET, MITF, MLH1*, MSH2*, MSH3*, MSH6*, MUTYH*, NF1*, NF2*, NTHL1*, PALB2*, PDGFRA, PMS2*, POLD1*, POLE*, POT1*, PRKAR1A*, PTCH1*, PTEN*, RAD51C*, RAD51D*, RB1*, RET, SDHA* (sequencing only), SDHAF2*, SDHB*, SDHC*, SDHD*, SMAD4*, SMARCA4*, SMARCB1*, SMARCE1*, STK11*, SUFU*, TMEM127*, TP53*, TSC1*, TSC2*, VHL*. RNA analysis is performed for * genes. The test report has been scanned into EPIC and is located under the Molecular Pathology section of the Results Review tab.  A portion of the result report is included below for reference.     We discussed with [name] that because current genetic testing is not perfect, it is possible there may be a gene mutation in one of these genes that current testing cannot detect, but that chance is small.  We also discussed, that there could be another gene that has not yet been discovered, or that we have not yet tested, that is responsible for the cancer diagnoses in the family. It is also possible there is a hereditary cause for the cancer in the family that [name] did not inherit and therefore was not identified in his testing.  Therefore, it is important to remain in touch with cancer genetics in the future so that we can continue to offer [name] the most up to date genetic testing.   Genetic testing did identify a  variant of uncertain significance (VUS) was identified in the POLD1 gene called c.14G>A (p.Arg5Gln).  At this time, it is unknown if this variant is associated with increased cancer risk or if this is a normal finding, but most variants such as this get reclassified to being inconsequential. It should not be used to make medical management decisions. With time, we suspect the lab will determine the significance of this variant, if any. If we do learn more about it, we will try to contact [name] to discuss it further. However, it is important to stay in touch with [place] periodically and keep the address and phone number up to date.  ADDITIONAL GENETIC TESTING: We discussed with [name] that his genetic testing was fairly extensive.  If there are genes identified to increase cancer risk that can be analyzed in the future, we would be happy to discuss and coordinate this testing at that time.    CANCER SCREENING RECOMMENDATIONS: [name] test result is considered negative (normal).  This means that we have not identified a hereditary cause for his personal and family history of polyps or his personal history of colon cancer at this time. Most cancers happen  by chance and this negative test suggests that his personal history of colon cancer may fall into this category.    Possible reasons for [name] negative genetic test include:  1. There may be a gene mutation in one of these genes that current testing methods cannot detect but that chance is small.  2. There could be another gene that has not yet been discovered, or that we have not yet tested, that is responsible for the cancer diagnoses in the family.  3.  There may be no hereditary risk for cancer in the family. The cancers in [name] and/or his family may be sporadic/familial or due to other genetic and environmental factors. 4. It is also possible there is a hereditary cause for the cancer in the family that [name] did not  inherit.  Therefore, it is recommended he continue to follow the cancer management and screening guidelines provided by his oncology and primary healthcare provider. An individual's cancer risk and medical management are not determined by genetic test results alone. Overall cancer risk assessment incorporates additional factors, including personal medical history, family history, and any available genetic information that may result in a personalized plan for cancer prevention and surveillance  This negative genetic test simply tells [place] that we cannot yet define why [name] has had an increased number of colorectal polyps or colorectal cancer at a young age. [name] medical management and screening should be based on the prospect that he may be at an increased risk for a second colorectal cancer in the future and should, therefore, undergo more frequent colonoscopy screening at intervals determined by his GI providers.  We also recommended that [name] have an upper endoscopy periodically.  RECOMMENDATIONS FOR FAMILY MEMBERS:   Since he did not inherit a identifiable mutation in a cancer predisposition gene included on this panel, his children could not have inherited a known mutation from his in one of these genes. Individuals in this family might be at some increased risk of developing cancer, over the general population risk, simply due to the family history of cancer.  We recommended women in this family have a yearly mammogram beginning at age 23, or 43 years younger than the earliest onset of cancer, an annual clinical breast exam, and perform monthly breast self-exams. Women in this family should also have a gynecological exam as recommended by their primary provider. All family members should be referred for colonoscopy starting at age 45, or 2 years younger than the earliest onset of cancer.  FOLLOW-UP: Lastly, we discussed with [name] that cancer genetics is a rapidly advancing field  and it is possible that new genetic tests will be appropriate for him and/or his family members in the future. We encouraged him to remain in contact with cancer genetics on an annual basis so we can update his personal and family histories and let him know of advances in cancer genetics that may benefit this family.   Our contact number was provided. [name] questions were answered to his satisfaction, and he knows he is welcome to call us at anytime with additional questions or concerns.   Maylon Cos, MS, [facility] Licensed, Certified Genetic Counselor [email or web address]

## 2023-06-16 NOTE — Telephone Encounter (Signed)
 LM on VM that results are back and to please call for results.  Left CB instructions.

## 2023-11-01 ENCOUNTER — Inpatient Hospital Stay: Payer: BC Managed Care – PPO | Admitting: Oncology

## 2023-11-01 ENCOUNTER — Ambulatory Visit: Payer: Self-pay | Admitting: Oncology

## 2023-11-01 ENCOUNTER — Inpatient Hospital Stay: Payer: BC Managed Care – PPO | Attending: Oncology

## 2023-11-01 VITALS — BP 119/78 | HR 54 | Temp 98.3°F | Resp 18 | Ht 70.0 in | Wt 193.3 lb

## 2023-11-01 DIAGNOSIS — K561 Intussusception: Secondary | ICD-10-CM

## 2023-11-01 DIAGNOSIS — D124 Benign neoplasm of descending colon: Secondary | ICD-10-CM | POA: Diagnosis not present

## 2023-11-01 DIAGNOSIS — I81 Portal vein thrombosis: Secondary | ICD-10-CM | POA: Diagnosis not present

## 2023-11-01 DIAGNOSIS — Z79899 Other long term (current) drug therapy: Secondary | ICD-10-CM | POA: Insufficient documentation

## 2023-11-01 DIAGNOSIS — D123 Benign neoplasm of transverse colon: Secondary | ICD-10-CM

## 2023-11-01 DIAGNOSIS — C184 Malignant neoplasm of transverse colon: Secondary | ICD-10-CM | POA: Diagnosis not present

## 2023-11-01 DIAGNOSIS — D122 Benign neoplasm of ascending colon: Secondary | ICD-10-CM | POA: Diagnosis not present

## 2023-11-01 LAB — CEA (ACCESS): CEA (CHCC): <1 ng/mL (ref 0.00–5.00)

## 2023-11-01 NOTE — Telephone Encounter (Signed)
 Voiced understanding, no further questions.

## 2023-11-01 NOTE — Telephone Encounter (Signed)
-----   Message from [name] sent at 11/01/2023 12:14 PM EDT ----- Please call patient, the CEA is normal, follow-up as scheduled  ----- Message ----- From: Rebecka, [facility] [place] Sent: 11/01/2023  12:08 PM EDT To: Arley KATHEE Hof, MD

## 2023-11-01 NOTE — Progress Notes (Signed)
  [facility] OFFICE PROGRESS NOTE   Diagnosis: Colon cancer, SMV thrombosis  INTERVAL HISTORY:   Nathan Terrell returns as scheduled.  He has a good appetite and energy level.  He reports increased stool frequency with loose stools for the past month.  No nausea or abdominal pain.  No bleeding.  He has not changed his diet.  Objective:  Vital signs in last 24 hours:  Blood pressure 119/78, pulse (!) 54, temperature 98.3 F (36.8 C), temperature source Oral, resp. rate 18, height 5' 10 (1.778 m), weight 193 lb 4.8 oz (87.7 kg), SpO2 100%.    Lymphatics: No cervical, supraclavicular, axillary, or inguinal nodes Resp: Lungs clear bilaterally Cardio: Regular rate and rhythm GI: No hepatosplenomegaly, no mass, nontender Vascular: No leg edema   Lab Results:  Lab Results  Component Value Date   WBC 7.0 11/20/2022   HGB 11.5 (L) 11/20/2022   HCT 36.0 (L) 11/20/2022   MCV 89.8 11/20/2022   PLT 252 11/20/2022   NEUTROABS 4.8 11/19/2022    CMP  Lab Results  Component Value Date   NA 136 11/19/2022   K 4.0 11/19/2022   CL 100 11/19/2022   CO2 27 11/19/2022   GLUCOSE 95 11/19/2022   BUN 18 11/19/2022   CREATININE 1.18 11/19/2022   CALCIUM 8.8 (L) 11/19/2022   PROT 7.1 11/19/2022   ALBUMIN 3.7 11/19/2022   AST 23 11/19/2022   ALT 27 11/19/2022   ALKPHOS 68 11/19/2022   BILITOT 0.3 11/19/2022   GFRNONAA >60 11/19/2022    Lab Results  Component Value Date   CEA <1.00 06/07/2023    No results found for: INR, LABPROT  Imaging:  No results found.  Medications: I have reviewed the patient's current medications.   Assessment/Plan: Colon cancer, transverse colon, age 43 (pT2pN0 (, status post an extended right colectomy 10/27/2022 Moderately differentiated adenocarcinoma mainly involving the submucosa with a small focus involving the superficial muscularis propria, 0/27 nodes, no loss of mismatch repair protein expression, MSS CT abdomen/pelvis  06/28/2022-short segment of colocolonic intussusception in the transverse colon with soft tissue fullness at the lead point Colonoscopy 06/30/2022-greater than 50 mm polyp in the transverse colon, resection incomplete-to be a villous adenoma with at least high-grade dysplasia, suspicion for submucosal invasion 12/27/2022: Guardant reveal-negative  Multiple colonic polyps on colonoscopy 06/30/2022-tubular adenomas removed from the ascending and descending colon 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 Admission 11/19/2022-heparin , discharged on Xarelto , completed 05/30/2023 Hypercoagulation panel 12/27/2022 negative aside from a positive lupus anticoagulant  Lupus anticoagulant negative on 06/07/2023  4.  Norovirus 06/29/2022  5.  Moderate-heavy alcohol use       Disposition: Nathan Terrell is in remission from colon cancer.  We will refer him to Dr. Melodie for a surveillance colonoscopy and to evaluate the increased frequency with loose stool.  He will return for an office visit, CEA, and genetic testing in 6 months.  The repeat lupus anticoagulant was negative while off of anticoagulation therapy.  The SMV thrombosis was likely related to abdominal surgery.  [name], MD  11/01/2023  11:45 AM

## 2023-11-03 ENCOUNTER — Encounter: Payer: Self-pay | Admitting: *Deleted

## 2023-11-03 ENCOUNTER — Other Ambulatory Visit: Payer: Self-pay | Admitting: *Deleted

## 2023-11-03 DIAGNOSIS — C184 Malignant neoplasm of transverse colon: Secondary | ICD-10-CM

## 2023-11-03 DIAGNOSIS — Z83719 Family history of colon polyps, unspecified: Secondary | ICD-10-CM

## 2023-11-03 NOTE — Progress Notes (Signed)
 Called Dr Curtis office for F/U colonoscopy scheduling and have faxed over referral to [phone]

## 2023-12-06 ENCOUNTER — Ambulatory Visit: Admitting: Podiatry

## 2023-12-06 ENCOUNTER — Ambulatory Visit (INDEPENDENT_AMBULATORY_CARE_PROVIDER_SITE_OTHER)

## 2023-12-06 DIAGNOSIS — M778 Other enthesopathies, not elsewhere classified: Secondary | ICD-10-CM

## 2023-12-06 DIAGNOSIS — S93621A Sprain of tarsometatarsal ligament of right foot, initial encounter: Secondary | ICD-10-CM

## 2023-12-07 NOTE — Progress Notes (Signed)
 Subjective:   Patient ID: Merilee JONELLE Gaskins, male   DOB: 43 y.o.   MRN: [id number]   HPI Chief Complaint  Patient presents with   Foot Injury    Right foot injury x 3 days. 9 pain walking. Non diabetic. Wearing ace wrap. Ice ibuprofen and elevation.   43 year old male presents the office today with the above concerns.  He states that he jumped over a fence landing straight on his foot and foot.  Yesterday he started to ice and use an Ace bandage and is feeling better today presented difficulty putting weight on the foot over the weekend.  No other treatment or any other injuries at this time.   Review of Systems  All other systems reviewed and are negative.  Past Medical History:  Diagnosis Date   Adenomatous polyps    Cough 03/12/2013   DJD (degenerative joint disease) 02/17/2013   right shoulder (acromial)   Family history of polyps in the colon    SLAP lesion, type II 02/17/2013   right    Past Surgical History:  Procedure Laterality Date   COLONOSCOPY WITH PROPOFOL  N/A 06/30/2022   Procedure: COLONOSCOPY WITH PROPOFOL ;  Surgeon: Rollin Dover, MD;  Location: [facility];  Service: Gastroenterology;  Laterality: N/A;   HEMOSTASIS CLIP PLACEMENT  06/30/2022   Procedure: HEMOSTASIS CLIP PLACEMENT;  Surgeon: Rollin Dover, MD;  Location: [facility];  Service: Gastroenterology;;   NO PAST SURGERIES     POLYPECTOMY  06/30/2022   Procedure: POLYPECTOMY;  Surgeon: Rollin Dover, MD;  Location: [facility];  Service: Gastroenterology;;   MATIAS  06/30/2022   Procedure: MATIAS;  Surgeon: Rollin Dover, MD;  Location: [facility];  Service: Gastroenterology;;   SHOULDER ARTHROSCOPY WITH SUBACROMIAL DECOMPRESSION, ROTATOR CUFF REPAIR AND BICEP TENDON REPAIR Right 03/20/2013   Procedure: RIGHT SHOULDER ARTHROSCOPY WITH SUBACROMIAL DECOMPRESSION, DISTAL CLAVICLE RESECTION, OPEN BICEPS TENODESIS;  Surgeon: Lamar LULLA Leonor Mickey., MD;  Location: [facility];   Service: Orthopedics;  Laterality: Right;   SUBMUCOSAL TATTOO INJECTION  06/30/2022   Procedure: SUBMUCOSAL TATTOO INJECTION;  Surgeon: Rollin Dover, MD;  Location: [facility];  Service: Gastroenterology;;     Current Outpatient Medications:    Multiple Vitamins-Minerals (MULTIVITAMIN WITH MINERALS) tablet, Take 1 tablet by mouth daily with breakfast., Disp: , Rfl:   No Known Allergies       Objective:  Physical Exam  General: AAO x3, NAD  Dermatological: Skin is warm, dry and supple bilateral.  There are no open sores, no preulcerative lesions, no rash or signs of infection present.  Vascular: Dorsalis Pedis artery and Posterior Tibial artery pedal pulses are 2/4 bilateral with immedate capillary fill time.  There is no pain with calf compression, swelling, warmth, erythema.   Neruologic: Grossly intact via light touch bilateral.   Musculoskeletal: Moderate tenderness is localized to the dorsal aspect of the foot along the metatarsal cuneiform joint on the second.  There is still edema present but compared to the pictures he shows me the swelling has improved.  There is mild ecchymosis of the toes.  Mild comfort of the distal fibula.  Achilles tendon intact as well as flexor, extensor tendons.  Gait: Unassisted, Nonantalgic.        Assessment:   43 year old male Lisfranc sprain     Plan:  -Treatment options discussed including all alternatives, risks, and complications -Etiology of symptoms were discussed -X-rays were obtained and reviewed with the patient.  Multiple views of the foot as well as ankle were obtained.  Not able appreciate any evidence of acute fracture.  Joint spaces maintained. -Discussed unlikely Lisfranc sprain and long-term sequela of this.  Recommend immobilization in the cam boot to help facilitate healing which was dispensed today.  He can continue with ice, elevation as well as anti-inflammatories as needed.  If still having symptoms in the next 2  weeks would recommend CT scan.  No follow-ups on file.   Donnice JONELLE Fees DPM

## 2024-05-01 ENCOUNTER — Inpatient Hospital Stay: Attending: Oncology

## 2024-05-01 ENCOUNTER — Inpatient Hospital Stay: Admitting: Oncology

## 2024-05-01 VITALS — BP 133/78 | HR 58 | Temp 98.6°F | Resp 18 | Ht 70.0 in | Wt 201.3 lb

## 2024-05-01 DIAGNOSIS — C184 Malignant neoplasm of transverse colon: Secondary | ICD-10-CM

## 2024-05-01 DIAGNOSIS — Z83719 Family history of colon polyps, unspecified: Secondary | ICD-10-CM

## 2024-05-01 LAB — CEA (ACCESS): CEA (CHCC): <1 ng/mL (ref 0.00–5.00)

## 2024-05-01 NOTE — Progress Notes (Signed)
 " [facility] OFFICE PROGRESS NOTE   Diagnosis: Colon cancer  INTERVAL HISTORY:   [name] returns as scheduled.  He feels well.  No difficulty with bowel function.  No bleeding.  He underwent a colonoscopy by Dr Rollin within the past few months (we do not have the colonoscopy or pathology report available today).  He reports intermittent dull discomfort at the right low anterior chest wall.  This has occurred for greater than 1 year and happens every few months.  The pain is chiefly present when he is on a plane or after drinking alcohol.  No associated symptoms.  The pain last for several hours.  No abdominal pain to suggest recurrence of the SMV thrombosis.  Objective:  Vital signs in last 24 hours:  Blood pressure 133/78, pulse (!) 58, temperature 98.6 F (37 C), temperature source Temporal, resp. rate 18, height 5' 10 (1.778 m), weight 201 lb 4.8 oz (91.3 kg), SpO2 100%.  Lymphatics: No cervical, supraclavicular, axillary, or inguinal nodes Resp: Lungs clear bilaterally Cardio: Regular rate and rhythm GI: No hepatosplenomegaly, nontender, no mass Vascular: No leg edema Musculoskeletal: No tenderness at the right anterior chest wall, no mass Skin: Nodularity at the mid aspect of the low transverse incision (patient reports this has been present chronically)   Lab Results:  Lab Results  Component Value Date   WBC 7.0 11/20/2022   HGB 11.5 (L) 11/20/2022   HCT 36.0 (L) 11/20/2022   MCV 89.8 11/20/2022   PLT 252 11/20/2022   NEUTROABS 4.8 11/19/2022    CMP  Lab Results  Component Value Date   NA 136 11/19/2022   K 4.0 11/19/2022   CL 100 11/19/2022   CO2 27 11/19/2022   GLUCOSE 95 11/19/2022   BUN 18 11/19/2022   CREATININE 1.18 11/19/2022   CALCIUM 8.8 (L) 11/19/2022   PROT 7.1 11/19/2022   ALBUMIN 3.7 11/19/2022   AST 23 11/19/2022   ALT 27 11/19/2022   ALKPHOS 68 11/19/2022   BILITOT 0.3 11/19/2022   GFRNONAA >60 11/19/2022    Lab Results   Component Value Date   CEA <1.00 11/01/2023   Medications: I have reviewed the patient's current medications.   Assessment/Plan:  Colon cancer, transverse colon, age 44 (pT2pN0 (, status post an extended right colectomy 10/27/2022 Moderately differentiated adenocarcinoma mainly involving the submucosa with a small focus involving the superficial muscularis propria, 0/27 nodes, no loss of mismatch repair protein expression, MSS CT abdomen/pelvis 06/28/2022-short segment of colocolonic intussusception in the transverse colon with soft tissue fullness at the lead point Colonoscopy 06/30/2022-greater than 50 mm polyp in the transverse colon, resection incomplete-to be a villous adenoma with at least high-grade dysplasia, suspicion for submucosal invasion 12/27/2022: Guardant reveal-negative  Multiple colonic polyps on colonoscopy 06/30/2022-tubular adenomas removed from the ascending and descending colon 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 Admission 11/19/2022-heparin , discharged on Xarelto , completed 05/30/2023 Hypercoagulation panel 12/27/2022 negative aside from a positive lupus anticoagulant  Lupus anticoagulant negative on 06/07/2023  4.  Norovirus 06/29/2022  5.  Moderate-heavy alcohol use     Disposition: Nathan Terrell is in clinical remission from colon cancer.  He will return for  an office visit and CEA in 6 months.  We will follow-up on the CEA from today.  He will undergo genetic testing today. We will follow-up on the recent colonoscopy report.  The etiology of the intermittent  discomfort at the right low anterior chest is unclear.  This is likely related to a benign musculoskeletal condition.  [name], MD  05/01/2024  2:45 PM   "

## 2024-10-29 ENCOUNTER — Inpatient Hospital Stay: Admitting: Oncology

## 2024-10-29 ENCOUNTER — Inpatient Hospital Stay
